# Patient Record
Sex: Female | Born: 1950
Health system: Southern US, Community
[De-identification: ages and names within clinical notes are randomized; demographics above are authoritative.]

## PROBLEM LIST (undated history)

## (undated) DIAGNOSIS — H269 Unspecified cataract: Secondary | ICD-10-CM

## (undated) DIAGNOSIS — K649 Unspecified hemorrhoids: Secondary | ICD-10-CM

## (undated) DIAGNOSIS — M1990 Unspecified osteoarthritis, unspecified site: Secondary | ICD-10-CM

## (undated) DIAGNOSIS — E785 Hyperlipidemia, unspecified: Secondary | ICD-10-CM

## (undated) DIAGNOSIS — C50919 Malignant neoplasm of unspecified site of unspecified female breast: Secondary | ICD-10-CM

## (undated) DIAGNOSIS — J00 Acute nasopharyngitis [common cold]: Secondary | ICD-10-CM

## (undated) DIAGNOSIS — M810 Age-related osteoporosis without current pathological fracture: Secondary | ICD-10-CM

## (undated) DIAGNOSIS — I10 Essential (primary) hypertension: Secondary | ICD-10-CM

## (undated) DIAGNOSIS — Z923 Personal history of irradiation: Secondary | ICD-10-CM

## (undated) DIAGNOSIS — K219 Gastro-esophageal reflux disease without esophagitis: Secondary | ICD-10-CM

## (undated) HISTORY — DX: Unspecified cataract: H26.9

## (undated) HISTORY — DX: Unspecified osteoarthritis, unspecified site: M19.90

## (undated) HISTORY — DX: Malignant neoplasm of unspecified site of unspecified female breast: C50.919

## (undated) HISTORY — DX: Essential (primary) hypertension: I10

## (undated) HISTORY — DX: Unspecified hemorrhoids: K64.9

## (undated) HISTORY — PX: TUBAL LIGATION: SHX77

## (undated) HISTORY — DX: Age-related osteoporosis without current pathological fracture: M81.0

## (undated) HISTORY — DX: Hyperlipidemia, unspecified: E78.5

---

## 2004-04-01 ENCOUNTER — Ambulatory Visit: Payer: Self-pay

## 2005-06-28 ENCOUNTER — Ambulatory Visit: Payer: Self-pay

## 2006-07-03 ENCOUNTER — Ambulatory Visit: Payer: Self-pay

## 2007-07-20 ENCOUNTER — Ambulatory Visit: Payer: Self-pay | Admitting: Unknown Physician Specialty

## 2007-07-24 ENCOUNTER — Ambulatory Visit: Payer: Self-pay | Admitting: Internal Medicine

## 2008-07-24 ENCOUNTER — Ambulatory Visit: Payer: Self-pay | Admitting: Internal Medicine

## 2009-07-28 ENCOUNTER — Ambulatory Visit: Payer: Self-pay | Admitting: Internal Medicine

## 2011-01-24 ENCOUNTER — Ambulatory Visit: Payer: Self-pay | Admitting: Internal Medicine

## 2012-08-29 DIAGNOSIS — Z720 Tobacco use: Secondary | ICD-10-CM | POA: Insufficient documentation

## 2012-08-29 DIAGNOSIS — M8580 Other specified disorders of bone density and structure, unspecified site: Secondary | ICD-10-CM | POA: Insufficient documentation

## 2012-08-29 DIAGNOSIS — E7800 Pure hypercholesterolemia, unspecified: Secondary | ICD-10-CM | POA: Insufficient documentation

## 2012-08-29 DIAGNOSIS — I10 Essential (primary) hypertension: Secondary | ICD-10-CM | POA: Insufficient documentation

## 2012-10-02 ENCOUNTER — Ambulatory Visit: Payer: Self-pay | Admitting: Family Medicine

## 2012-11-28 DIAGNOSIS — R32 Unspecified urinary incontinence: Secondary | ICD-10-CM | POA: Insufficient documentation

## 2012-11-28 DIAGNOSIS — K219 Gastro-esophageal reflux disease without esophagitis: Secondary | ICD-10-CM | POA: Insufficient documentation

## 2012-11-28 DIAGNOSIS — K644 Residual hemorrhoidal skin tags: Secondary | ICD-10-CM | POA: Insufficient documentation

## 2014-02-21 ENCOUNTER — Ambulatory Visit: Payer: Self-pay | Admitting: Family Medicine

## 2014-02-25 ENCOUNTER — Ambulatory Visit: Payer: Self-pay | Admitting: Family Medicine

## 2014-02-26 DIAGNOSIS — R928 Other abnormal and inconclusive findings on diagnostic imaging of breast: Secondary | ICD-10-CM | POA: Insufficient documentation

## 2014-03-24 ENCOUNTER — Ambulatory Visit: Payer: Self-pay | Admitting: Unknown Physician Specialty

## 2014-06-06 HISTORY — PX: COLONOSCOPY: SHX174

## 2014-08-05 DIAGNOSIS — Z2821 Immunization not carried out because of patient refusal: Secondary | ICD-10-CM | POA: Insufficient documentation

## 2014-08-27 ENCOUNTER — Ambulatory Visit: Payer: Self-pay | Admitting: *Deleted

## 2014-09-29 LAB — SURGICAL PATHOLOGY

## 2015-06-07 DIAGNOSIS — Z923 Personal history of irradiation: Secondary | ICD-10-CM

## 2015-06-07 HISTORY — DX: Personal history of irradiation: Z92.3

## 2015-11-13 ENCOUNTER — Other Ambulatory Visit: Payer: Self-pay | Admitting: Internal Medicine

## 2015-11-13 DIAGNOSIS — M8580 Other specified disorders of bone density and structure, unspecified site: Secondary | ICD-10-CM

## 2015-12-24 ENCOUNTER — Other Ambulatory Visit: Payer: Self-pay | Admitting: *Deleted

## 2015-12-24 DIAGNOSIS — Z1239 Encounter for other screening for malignant neoplasm of breast: Secondary | ICD-10-CM

## 2016-01-11 ENCOUNTER — Other Ambulatory Visit: Payer: Self-pay

## 2016-01-11 ENCOUNTER — Ambulatory Visit: Payer: Self-pay

## 2016-02-01 ENCOUNTER — Encounter: Payer: Self-pay | Admitting: Radiology

## 2016-02-01 ENCOUNTER — Ambulatory Visit
Admission: RE | Admit: 2016-02-01 | Discharge: 2016-02-01 | Disposition: A | Discharge status: Home / Self Care | Source: Ambulatory Visit | Attending: *Deleted | Admitting: *Deleted

## 2016-02-01 DIAGNOSIS — Z1239 Encounter for other screening for malignant neoplasm of breast: Secondary | ICD-10-CM

## 2016-02-01 DIAGNOSIS — N63 Unspecified lump in breast: Secondary | ICD-10-CM | POA: Diagnosis not present

## 2016-02-01 DIAGNOSIS — R921 Mammographic calcification found on diagnostic imaging of breast: Secondary | ICD-10-CM | POA: Insufficient documentation

## 2016-02-01 DIAGNOSIS — R928 Other abnormal and inconclusive findings on diagnostic imaging of breast: Secondary | ICD-10-CM | POA: Diagnosis present

## 2016-02-09 ENCOUNTER — Other Ambulatory Visit: Payer: Self-pay | Admitting: *Deleted

## 2016-02-09 DIAGNOSIS — N6320 Unspecified lump in the left breast, unspecified quadrant: Secondary | ICD-10-CM

## 2016-02-15 ENCOUNTER — Ambulatory Visit
Admission: RE | Admit: 2016-02-15 | Discharge: 2016-02-15 | Disposition: A | Discharge status: Home / Self Care | Source: Ambulatory Visit | Attending: *Deleted | Admitting: *Deleted

## 2016-02-15 ENCOUNTER — Encounter: Payer: Self-pay | Admitting: Oncology

## 2016-02-15 ENCOUNTER — Other Ambulatory Visit: Payer: Self-pay | Admitting: Rheumatology

## 2016-02-15 DIAGNOSIS — N6320 Unspecified lump in the left breast, unspecified quadrant: Secondary | ICD-10-CM

## 2016-02-15 DIAGNOSIS — N63 Unspecified lump in breast: Secondary | ICD-10-CM | POA: Diagnosis present

## 2016-02-15 DIAGNOSIS — C50919 Malignant neoplasm of unspecified site of unspecified female breast: Secondary | ICD-10-CM

## 2016-02-15 DIAGNOSIS — C50912 Malignant neoplasm of unspecified site of left female breast: Secondary | ICD-10-CM | POA: Insufficient documentation

## 2016-02-15 HISTORY — PX: BREAST BIOPSY: SHX20

## 2016-02-15 HISTORY — DX: Malignant neoplasm of unspecified site of unspecified female breast: C50.919

## 2016-02-17 LAB — SURGICAL PATHOLOGY

## 2016-02-18 NOTE — Progress Notes (Signed)
  Oncology Nurse Navigator Documentation  Navigator Location: [facility] (02/18/16 1200) Navigator Encounter Type: Introductory phone call (02/18/16 1200)   Abnormal Finding Date: 02/01/16 (02/18/16 1200) Confirmed Diagnosis Date: 02/15/16 (02/18/16 1200)     Patient Visit Type: Initial (02/18/16 1200) Treatment Phase: Pre-Tx/Tx Discussion (02/18/16 1200) Barriers/Navigation Needs: Education;Coordination of Care (02/18/16 1200) Education: Accessing Care/ Finding Providers;Coping with Diagnosis/ Prognosis;Newly Diagnosed Cancer Education (02/18/16 1200) Interventions: Coordination of Care (02/18/16 1200)   Coordination of Care: Appts (02/18/16 1200)                  Time Spent with Patient: 60 (02/18/16 1200)  Introduced patient to [employer].  Scheduled Surgical consult for Monday 02/22/16 at 11:30 with Dr. Bary Castilla, and MedOnc with Dr. Grayland Ormond Tuesday 02/23/16 at 10:00.  Notified primary physician of appointments.  Collected risk factor/history data for case conference.  Patient request to receive Breast Cancer Treatment Handbook at physician appointment.

## 2016-02-19 ENCOUNTER — Encounter: Payer: Self-pay | Admitting: *Deleted

## 2016-02-22 ENCOUNTER — Ambulatory Visit (INDEPENDENT_AMBULATORY_CARE_PROVIDER_SITE_OTHER): Admitting: General Surgery

## 2016-02-22 ENCOUNTER — Ambulatory Visit: Payer: Self-pay

## 2016-02-22 ENCOUNTER — Encounter: Payer: Self-pay | Admitting: General Surgery

## 2016-02-22 VITALS — BP 150/82 | HR 76 | Resp 12 | Ht 66.0 in | Wt 209.0 lb

## 2016-02-22 DIAGNOSIS — C50412 Malignant neoplasm of upper-outer quadrant of left female breast: Secondary | ICD-10-CM

## 2016-02-22 DIAGNOSIS — N6320 Unspecified lump in the left breast, unspecified quadrant: Secondary | ICD-10-CM

## 2016-02-22 DIAGNOSIS — C50919 Malignant neoplasm of unspecified site of unspecified female breast: Secondary | ICD-10-CM | POA: Insufficient documentation

## 2016-02-22 NOTE — Progress Notes (Addendum)
[place]  [phone] [phone]  [name] OB: [birth year]  MR#: [id number]  STM#:[id number]  Patient Care Team: Peggyann Juba, NP as PCP - General Peggyann Juba, NP as Nurse Practitioner Robert Bellow, MD (General Surgery)  CHIEF COMPLAINT: Clinical stage IA ER/PR positive adenocarcinoma of the upper outer quadrant of the left breast.  INTERVAL HISTORY: Patient is a 65 year old female who was noted to have an abnormality on routine breast screening mammogram. Supplement ultrasound and biopsy revealed the above stated breast cancer. She currently feels well and is asymptomatic. She has no neurologic plates. She denies any recent fevers or illnesses. She has good appetite and denies weight loss. She has no chest pain or shortness of breath. She denies any nausea, vomiting, constipation, or diarrhea. She has no urinary complaints. Patient feels at her baseline and offers no specific complaints today.  REVIEW OF SYSTEMS:   Review of Systems  Constitutional: Negative.  Negative for fever, malaise/fatigue and weight loss.  Respiratory: Negative.  Negative for cough and shortness of breath.   Cardiovascular: Negative.  Negative for chest pain.  Gastrointestinal: Negative for abdominal pain.  Genitourinary: Negative.   Musculoskeletal: Negative.   Neurological: Negative.  Negative for weakness.  Psychiatric/Behavioral: Negative.  The patient is not nervous/anxious.     As per HPI. Otherwise, a complete review of systems is negative.  PAST MEDICAL HISTORY: Past Medical History:  Diagnosis Date  . Arthritis   . Breast cancer ([place])   . Cataract   . Hemorrhoids   . Hyperlipidemia   . Hypertension   . Osteoporosis     PAST SURGICAL HISTORY: Past Surgical History:  Procedure Laterality Date  . BREAST BIOPSY Left 02/15/2016   path pending  . COLONOSCOPY  2016    FAMILY HISTORY: Family History  Problem Relation Age of  Onset  . Colon cancer Mother   . Breast cancer Maternal Aunt   . Prostate cancer Father     ADVANCED DIRECTIVES (Y/N):  N  HEALTH MAINTENANCE: Social History  Substance Use Topics  . Smoking status: Current Every Day Smoker    Packs/day: 1.00    Years: 40.00    Types: Cigarettes  . Smokeless tobacco: Never Used  . Alcohol use 1.2 oz/week    2 Glasses of wine per week     Colonoscopy:  PAP:  Bone density:  Lipid panel:  Allergies  Allergen Reactions  . Ibuprofen Swelling  . Sulfamethizole Rash    Other reaction(s): UNKNOWN    Current Outpatient Prescriptions  Medication Sig Dispense Refill  . atorvastatin (LIPITOR) 20 MG tablet Take 20 mg by mouth daily at 6 PM.     . calcium carbonate (OSCAL) 1500 (600 Ca) MG TABS tablet Take 1,500 mg by mouth daily with breakfast.    . folic acid (FOLVITE) 1 MG tablet Take 1 mg by mouth daily.    Marland Kitchen losartan-hydrochlorothiazide (HYZAAR) 50-12.5 MG tablet Take 1 tablet by mouth daily.     . Multiple Vitamin (MULTIVITAMIN) tablet Take 1 tablet by mouth daily.    . naproxen sodium (ANAPROX) 220 MG tablet Take 440 mg by mouth 2 (two) times daily with a meal.    . ranitidine (ZANTAC) 150 MG tablet Take 150 mg by mouth daily.      No current facility-administered medications for this visit.     OBJECTIVE: Vitals:   02/23/16 0959  BP: 137/81  Pulse: (!) 101  Resp: 18  Temp: 97.6  F (36.4 C)     Body mass index is 33.2 kg/m.    ECOG FS:0 - Asymptomatic  General: Well-developed, well-nourished, no acute distress. Eyes: Pink conjunctiva, anicteric sclera. HEENT: Normocephalic, moist mucous membranes, clear oropharnyx. Breasts: Patient had breast exam yesterday with surgery and requested exam be deferred today. Lungs: Clear to auscultation bilaterally. Heart: Regular rate and rhythm. No rubs, murmurs, or gallops. Abdomen: Soft, nontender, nondistended. No organomegaly noted, normoactive bowel sounds. Musculoskeletal: No edema,  cyanosis, or clubbing. Neuro: Alert, answering all questions appropriately. Cranial nerves grossly intact. Skin: No rashes or petechiae noted. Psych: Normal affect. Lymphatics: No cervical, calvicular, axillary or inguinal LAD.   LAB RESULTS:  No results found for: NA, K, CL, CO2, GLUCOSE, BUN, CREATININE, CALCIUM, PROT, ALBUMIN, AST, ALT, ALKPHOS, BILITOT, GFRNONAA, GFRAA  No results found for: WBC, NEUTROABS, HGB, HCT, MCV, PLT   STUDIES: US Breast Complete Uni Left Inc Axilla  Result Date: 02/22/2016 Ultrasound examination of the left breast was completed to determine if preoperative needle localization would be required. Moderate distortion from her recently completed biopsy is evident. In the 2:00 position of the breast, 5 cm from nipple and area of architectural distortion nodularity corresponding to that previously identified is seen. This measures up to 0.64 cm in diameter. BI-RADS-6.  US Breast Ltd Uni Left Inc Axilla  Result Date: 02/01/2016 CLINICAL DATA:  Patient for short-term follow-up left breast calcifications. EXAM: 2D DIGITAL DIAGNOSTIC BILATERAL MAMMOGRAM WITH CAD AND ADJUNCT TOMO ULTRASOUND LEFT BREAST COMPARISON:  Previous exam(s). ACR Breast Density Category b: There are scattered areas of fibroglandular density. FINDINGS: Magnification CC and true lateral views demonstrate the previously described punctate calcifications to be less discrete on current examination, compatible with benign etiology. No new or increasing calcifications are identified. Additionally, within the upper-outer left breast there is an 8 mm irregular mass. No suspicious abnormalities identified within the right breast. Mammographic images were processed with CAD. On physical exam, I palpate no discrete mass within the upper-outer left breast. Targeted ultrasound is performed, showing a 7 x 4 x 7 mm irregular hypoechoic mass left breast 2 o'clock position 6 cm from nipple. No left axillary  lymphadenopathy. IMPRESSION: Suspicious left breast mass. RECOMMENDATION: Ultrasound-guided core needle biopsy left breast mass. I have discussed the findings and recommendations with the patient. Results were also provided in writing at the conclusion of the visit. If applicable, a reminder letter will be sent to the patient regarding the next appointment. BI-RADS CATEGORY  4: Suspicious. Electronically Signed   By: Lovey Newcomer M.D.   On: 02/01/2016 15:47   Mm Diag Breast Tomo Bilateral  Result Date: 02/01/2016 CLINICAL DATA:  Patient for short-term follow-up left breast calcifications. EXAM: 2D DIGITAL DIAGNOSTIC BILATERAL MAMMOGRAM WITH CAD AND ADJUNCT TOMO ULTRASOUND LEFT BREAST COMPARISON:  Previous exam(s). ACR Breast Density Category b: There are scattered areas of fibroglandular density. FINDINGS: Magnification CC and true lateral views demonstrate the previously described punctate calcifications to be less discrete on current examination, compatible with benign etiology. No new or increasing calcifications are identified. Additionally, within the upper-outer left breast there is an 8 mm irregular mass. No suspicious abnormalities identified within the right breast. Mammographic images were processed with CAD. On physical exam, I palpate no discrete mass within the upper-outer left breast. Targeted ultrasound is performed, showing a 7 x 4 x 7 mm irregular hypoechoic mass left breast 2 o'clock position 6 cm from nipple. No left axillary lymphadenopathy. IMPRESSION: Suspicious left breast mass. RECOMMENDATION: Ultrasound-guided core  needle biopsy left breast mass. I have discussed the findings and recommendations with the patient. Results were also provided in writing at the conclusion of the visit. If applicable, a reminder letter will be sent to the patient regarding the next appointment. BI-RADS CATEGORY  4: Suspicious. Electronically Signed   By: Lovey Newcomer M.D.   On: 02/01/2016 15:47   Mm Clip  Placement Left  Result Date: 02/15/2016 CLINICAL DATA:  Status post ultrasound-guided biopsy of a left breast mass earlier today. EXAM: DIAGNOSTIC LEFT MAMMOGRAM POST ULTRASOUND BIOPSY COMPARISON:  Previous exam(s). FINDINGS: Mammographic images were obtained following ultrasound guided biopsy of the left breast mass at the 2 o'clock axis. At the conclusion of the procedure, a coil shaped tissue marker was placed at the biopsy site. This biopsy clip is well positioned at the biopsy site, corresponding to the original mammographic finding. IMPRESSION: Postprocedure mammogram for clip placement. Coil shaped clip well-positioned at the biopsy site. Final Assessment: Post Procedure Mammograms for Marker Placement Electronically Signed   By: Franki Cabot M.D.   On: 02/15/2016 09:12   [place] Lt Breast Bx W Loc Dev 1st Lesion Img Bx Spec US Guide  Addendum Date: 02/18/2016   ADDENDUM REPORT: 02/18/2016 12:48 ADDENDUM: Pathology and recommendations were relayed to Al Pimple, RN, nurse navigator for [place] by Jetta Lout, [place] [facility]). An appointment has been made with Dr. Bary Castilla, surgeon, on 02/22/16 at 11:30 AM and with Dr. Grayland Ormond, oncologist, on 02/23/16 at 10:00 AM. The patient has been notified by the nurse navigator. Addendum by Jetta Lout, RRA on 02/18/16. Electronically Signed   By: Claudie Revering M.D.   On: 02/18/2016 12:48   Addendum Date: 02/16/2016   ADDENDUM REPORT: 02/16/2016 17:56 ADDENDUM: The final pathological diagnosis is invasive mammary carcinoma. This is concordant with the imaging findings. Surgical consultation is recommended. The final pathological diagnosis and recommendation were discussed with the patient by telephone on 02/16/2016. Her questions were answered. She reported no pain, bruising or palpable hematoma at the biopsy site. The patient would prefer to have her surgery and treatments at [facility]. She does  not have a Psychologist, sport and exercise preference. At Dr. Thompson Caul request, surgical and oncological consultation will be arranged by the nurse navigators at the [facility] . Electronically Signed   By: Claudie Revering M.D.   On: 02/16/2016 17:56   Result Date: 02/18/2016 CLINICAL DATA:  Patient with left breast mass presents today for ultrasound-guided biopsy. EXAM: ULTRASOUND GUIDED LEFT BREAST CORE NEEDLE BIOPSY COMPARISON:  Previous exam(s). PROCEDURE: I met with the patient and we discussed the procedure of ultrasound-guided biopsy, including benefits and alternatives. We discussed the high likelihood of a successful procedure. We discussed the risks of the procedure including infection, bleeding, tissue injury, clip migration, and inadequate sampling. Informed written consent was given. The usual time-out protocol was performed immediately prior to the procedure. Using sterile technique and 1% Lidocaine as local anesthetic, under direct ultrasound visualization, a 12 gauge spring-loaded device was used to perform biopsy of the left breast mass at the 2 o'clock axisusing a lateral approach. At the conclusion of the procedure, a coil shaped tissue marker clip was deployed into the biopsy cavity. Follow-up 2-view mammogram was performed and dictated separately. IMPRESSION: Ultrasound-guided biopsy of the left breast mass at the 2 o'clock axis. No apparent complications. Electronically Signed: By: Franki Cabot M.D. On: 02/15/2016 09:01    ASSESSMENT: Clinical stage IA ER/PR positive  adenocarcinoma of the upper outer quadrant of the left breast. Low risk MammoPrint.  PLAN:    1. Clinical stage IA ER/PR positive adenocarcinoma of the upper outer quadrant of the left breast: Patient had consultation with surgery yesterday to discuss lumpectomy past radiation versus mastectomy. Prior to undergoing surgery, patient has a wedding in [place] therefore will likely schedule her surgery on or about March 17, 2016. Patient will return to clinic 1-2 weeks after her surgery to discuss the final pathology results and to determine whether chemotherapy is necessary. Ultimately, at the conclusion of all patients treatments, she will require an aromatase inhibitor for 5 years.  Approximately 45 minutes was spent in discussion of which greater than 50% was consultation.  Patient expressed understanding and was in agreement with this plan. She also understands that She can call clinic at any time with any questions, concerns, or complaints.   Breast cancer of upper-outer quadrant of left female breast [facility])   Staging form: Breast, AJCC 7th Edition   - Clinical stage from 02/22/2016: Stage IA (T1b, N0, M0) - Signed by Lloyd Huger, MD on 02/22/2016  Lloyd Huger, MD   02/23/2016 12:35 PM

## 2016-02-22 NOTE — Progress Notes (Addendum)
Patient [name], female   DOB: [birth year], 65 y.o.   MRN: [id number]  Chief Complaint  Patient presents with  . Follow-up    mammogram    HPI Karen Phillips is a 65 y.o. female who presents for a breast evaluation. The most recent mammogram was done on 02/01/16 and biopsy done on 02/15/16. Marland Kitchen  Patient does perform regular self breast checks and gets regular mammograms done.   The patient reports that she is not aware of any changes in her breast.  She was accompanied today by her husband of 34 years, [name]. He is retired from [employer]. 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  HPI  Past Medical History:  Diagnosis Date  . Hyperlipidemia   . Hypertension     Past Surgical History:  Procedure Laterality Date  . BREAST BIOPSY Left 02/15/2016   path pending  . COLONOSCOPY  2016    Family History  Problem Relation Age of Onset  . Colon cancer Mother   . Breast cancer Maternal Aunt     Social History Social History  Substance Use Topics  . Smoking status: Current Every Day Smoker    Packs/day: 1.00    Years: 40.00    Types: Cigarettes  . Smokeless tobacco: Never Used  . Alcohol use 1.2 oz/week    2 Glasses of wine per week    Allergies  Allergen Reactions  . Ibuprofen Swelling  . Sulfamethizole Rash    Other reaction(s): UNKNOWN    Current Outpatient Prescriptions  Medication Sig Dispense Refill  . aspirin EC 81 MG tablet Take 81 mg by mouth.    Marland Kitchen atorvastatin (LIPITOR) 20 MG tablet Take by mouth.    . losartan-hydrochlorothiazide (HYZAAR) 50-12.5 MG tablet     . ranitidine (ZANTAC) 150 MG tablet Take by mouth.     No current facility-administered medications for this visit.     Review of Systems Review of Systems  Constitutional: Negative.   Respiratory: Negative.   Cardiovascular: Negative.     Blood pressure (!) 150/82, pulse 76, resp. rate 12, height _0  (1.676 m), weight 209 lb (94.8 kg).  Physical  Exam Physical Exam  Constitutional: She is oriented to person, place, and time. She appears well-developed and well-nourished.  Eyes: Conjunctivae are normal. No scleral icterus.  Neck: Neck supple.  Cardiovascular: Normal rate, regular rhythm and normal heart sounds.   Pulmonary/Chest: Effort normal and breath sounds normal. Right breast exhibits no inverted nipple, no mass, no nipple discharge, no skin change and no tenderness. Left breast exhibits no inverted nipple, no nipple discharge, no skin change and no tenderness.    Abdominal: Soft. Normal appearance and bowel sounds are normal. There is no hepatomegaly. There is no tenderness. No hernia.  Lymphadenopathy:    She has no cervical adenopathy.    She has no axillary adenopathy.  Neurological: She is alert and oriented to person, place, and time.  Skin: Skin is warm and dry.    Data Reviewed Biopsy results of 04/16/2016 were reviewed. 7 mm invasive mammary carcinoma, ER 90%, PR 90%, HER-2/neu not overexpressed.  Preprocedure mammograms and ultrasound reviewed.  Ultrasound examination of the left breast was completed to determine if preoperative needle localization would be required. Moderate distortion from her recently completed biopsy is evident. In the 2:00 position of the breast, 5 cm from nipple and area of architectural distortion nodularity corresponding to that previously identified is seen. This measures up to  0.64 cm in diameter. BI-RADS-6.  Assessment    Stage 1 carcinoma left breast.    Plan    The majority of the visit was spent reviewing the options for breast cancer treatment. Breast conservation with lumpectomy and radiation therapy  was presented as equivalent to mastectomy for long-term control. The pros and cons of each treatment regimen were reviewed. The indications for additional therapy such as chemotherapy were touched on briefly, realizing that the majority of information required to determine if  chemotherapy would be of benefit is not available at this time. In sees the patient is presently scheduled to meet with medical oncology tomorrow). The availability of consultation services for medical oncology and radiation oncology prior to surgery were reviewed.  Options for second surgical opinion were reviewed.  Likely benefit of Mammoprint testing was discussed.  Informational brochure and website information provided.  The patient and her husband are scheduled to go to a wedding in the next couple of weeks. It's very reasonable postpone any surgical intervention until after the return. Would have appreciative visit preop to confirm the biopsy site is clearly identifiable and the needle localization is not required.  There were encouraged to call for any questions. About 45 minutes were spent reviewing options for management.     This information has been scribed by Gaspar Cola CMA.   Robert Bellow 02/22/2016, 9:25 PM

## 2016-02-23 ENCOUNTER — Encounter (INDEPENDENT_AMBULATORY_CARE_PROVIDER_SITE_OTHER): Payer: Self-pay

## 2016-02-23 ENCOUNTER — Encounter: Payer: Self-pay | Admitting: Oncology

## 2016-02-23 ENCOUNTER — Inpatient Hospital Stay: Attending: Oncology | Admitting: Oncology

## 2016-02-23 DIAGNOSIS — Z79899 Other long term (current) drug therapy: Secondary | ICD-10-CM | POA: Diagnosis not present

## 2016-02-23 DIAGNOSIS — C50412 Malignant neoplasm of upper-outer quadrant of left female breast: Secondary | ICD-10-CM | POA: Diagnosis present

## 2016-02-23 DIAGNOSIS — E785 Hyperlipidemia, unspecified: Secondary | ICD-10-CM | POA: Insufficient documentation

## 2016-02-23 DIAGNOSIS — M1990 Unspecified osteoarthritis, unspecified site: Secondary | ICD-10-CM | POA: Diagnosis not present

## 2016-02-23 DIAGNOSIS — I10 Essential (primary) hypertension: Secondary | ICD-10-CM | POA: Insufficient documentation

## 2016-02-23 DIAGNOSIS — M818 Other osteoporosis without current pathological fracture: Secondary | ICD-10-CM | POA: Insufficient documentation

## 2016-02-23 DIAGNOSIS — Z170 Estrogen receptor positive status [ER+]: Secondary | ICD-10-CM | POA: Diagnosis not present

## 2016-02-23 DIAGNOSIS — F17210 Nicotine dependence, cigarettes, uncomplicated: Secondary | ICD-10-CM | POA: Diagnosis not present

## 2016-02-23 NOTE — Progress Notes (Signed)
  Oncology Nurse Navigator Documentation  Navigator Location: [facility] Onc (02/23/16 1400) Navigator Encounter Type: Initial MedOnc (02/23/16 1400)           Patient Visit Type: Initial;MedOnc (02/23/16 1400) Treatment Phase: Pre-Tx/Tx Discussion (02/23/16 1400) Barriers/Navigation Needs: Education;Coordination of Care (02/23/16 1400) Education: Newly Diagnosed Cancer Education;Preparing for Upcoming Surgery/ Treatment (02/23/16 1400) Interventions: Coordination of Care (02/23/16 1400)                      Time Spent with Patient: 120 (02/23/16 1400)   Met patient and husband at initial [place] visit.   Given Breast Cancer Treatment Handbook.  Phoned Wells Guiles at [facility] to notify patient interested in lumpectomy ,and would like to schedule 03/17/16 or later.  She will be out of town until 03/14/16.

## 2016-02-23 NOTE — Progress Notes (Signed)
New evaluation for breast cancer. States is feeling well. Offers no complaints.

## 2016-02-25 ENCOUNTER — Other Ambulatory Visit: Payer: Self-pay | Admitting: Rheumatology

## 2016-02-25 DIAGNOSIS — M25552 Pain in left hip: Secondary | ICD-10-CM

## 2016-02-25 DIAGNOSIS — R937 Abnormal findings on diagnostic imaging of other parts of musculoskeletal system: Secondary | ICD-10-CM

## 2016-02-29 ENCOUNTER — Telehealth: Payer: Self-pay

## 2016-02-29 NOTE — Telephone Encounter (Signed)
Call back to patient to see about scheduling her breast surgery. She states that she will be out of town starting on 03/09/16 and will be gone until 03/15/16. She would like her surgery for the following week. The patient is scheduled for surgery at [facility] on 03/22/16. She will pre admit by phone. She will follow up here for a pre op appointment with Dr Bary Castilla on 03/17/16 at 9:15 am. The patient is aware of dates, time, and instructions.

## 2016-03-01 ENCOUNTER — Other Ambulatory Visit: Payer: Self-pay | Admitting: General Surgery

## 2016-03-03 ENCOUNTER — Other Ambulatory Visit: Payer: Self-pay | Admitting: General Surgery

## 2016-03-04 ENCOUNTER — Other Ambulatory Visit: Payer: Self-pay | Admitting: General Surgery

## 2016-03-04 ENCOUNTER — Other Ambulatory Visit: Payer: Self-pay

## 2016-03-04 DIAGNOSIS — C50412 Malignant neoplasm of upper-outer quadrant of left female breast: Secondary | ICD-10-CM

## 2016-03-07 ENCOUNTER — Ambulatory Visit
Admission: RE | Admit: 2016-03-07 | Discharge: 2016-03-07 | Disposition: A | Discharge status: Home / Self Care | Source: Ambulatory Visit | Attending: Rheumatology | Admitting: Rheumatology

## 2016-03-07 DIAGNOSIS — M12852 Other specific arthropathies, not elsewhere classified, left hip: Secondary | ICD-10-CM | POA: Diagnosis not present

## 2016-03-07 DIAGNOSIS — K5730 Diverticulosis of large intestine without perforation or abscess without bleeding: Secondary | ICD-10-CM | POA: Insufficient documentation

## 2016-03-07 DIAGNOSIS — I708 Atherosclerosis of other arteries: Secondary | ICD-10-CM | POA: Diagnosis not present

## 2016-03-07 DIAGNOSIS — M25552 Pain in left hip: Secondary | ICD-10-CM

## 2016-03-07 DIAGNOSIS — R937 Abnormal findings on diagnostic imaging of other parts of musculoskeletal system: Secondary | ICD-10-CM

## 2016-03-15 ENCOUNTER — Inpatient Hospital Stay: Admission: RE | Admit: 2016-03-15 | Source: Ambulatory Visit

## 2016-03-17 ENCOUNTER — Encounter: Payer: Self-pay | Admitting: General Surgery

## 2016-03-17 ENCOUNTER — Ambulatory Visit (INDEPENDENT_AMBULATORY_CARE_PROVIDER_SITE_OTHER): Admitting: General Surgery

## 2016-03-17 ENCOUNTER — Encounter: Payer: Self-pay | Admitting: *Deleted

## 2016-03-17 VITALS — BP 144/82 | HR 82 | Resp 14 | Ht 66.0 in | Wt 205.0 lb

## 2016-03-17 DIAGNOSIS — C50412 Malignant neoplasm of upper-outer quadrant of left female breast: Secondary | ICD-10-CM

## 2016-03-17 NOTE — Patient Instructions (Signed)
The patient is aware to call back for any questions or concerns.  

## 2016-03-17 NOTE — Patient Instructions (Addendum)
  Your procedure is scheduled on: 03/22/16 Report to Radiology. At 9:15 AM   Remember: Instructions that are not followed completely may result in serious medical risk, up to and including death, or upon the discretion of your surgeon and anesthesiologist your surgery may need to be rescheduled.    X____ 1. Do not eat food or drink liquids after midnight. No gum chewing or hard candies.     __X__ 2. No Alcohol for 24 hours before or after surgery.   __X__ 3. Do Not Smoke For 24 Hours Prior to Your Surgery.   ____ 4. Bring all medications with you on the day of surgery if instructed.    _X___ 5. Notify your doctor if there is any change in your medical condition     (cold, fever, infections).       Do not wear jewelry, make-up, hairpins, clips or nail polish.  Do not wear lotions, powders, or perfumes. You may wear deodorant.  Do not shave 48 hours prior to surgery. Men may shave face and neck.  Do not bring valuables to the hospital.    [facility] is not responsible for any belongings or valuables.               Contacts, dentures or bridgework may not be worn into surgery.  Leave your suitcase in the car. After surgery it may be brought to your room.  For patients admitted to the hospital, discharge time is determined by your                treatment team.   Patients discharged the day of surgery will not be allowed to drive home.      __X__ Take these medicines the morning of surgery with A SIP OF WATER:    1. ZANTAC AT BEDTIME 03/21/16 AND AM OF SURGERY  2.   3.   4.  5.  6.  ____ Fleet Enema (as directed)   _X___ Use CHG Soap as directed  ____ Use inhalers on the day of surgery  ____ Stop metformin 2 days prior to surgery    ____ Take 1/2 of usual insulin dose the night before surgery and none on the morning of surgery.   ____ Stop Coumadin/Plavix/aspirin on   _X ___ Stop Anti-inflammatories on      STOP NAPROXYN AS INSTRUCTED BY DR [name]'S OFFICE   ____  Stop supplements until after surgery.    ____ Bring C-Pap to the hospital.   GIVEN LIVING WILL PACKET

## 2016-03-17 NOTE — Progress Notes (Signed)
Patient ID: MUNTAZ PALLAN, female   DOB: Aug 15, 1950, 65 y.o.   MRN: [id number]  Chief Complaint  Patient presents with  . Pre-op Exam    HPI Karen Phillips is a 65 y.o. female here today for her pre op for surgery excision left breast mass with SN biopsy on 03-22-16. The long, drawn out battle to allow the patient have surgery locally has finally resolved. She is here today with her husband, Karen Phillips.  HPI  Past Medical History:  Diagnosis Date  . Arthritis   . Breast cancer ([place]) 02/15/2016   INVASIVE MAMMARY CARCINOMA  . Cataract   . Hemorrhoids   . Hyperlipidemia   . Hypertension   . Osteoporosis     Past Surgical History:  Procedure Laterality Date  . BREAST BIOPSY Left 02/15/2016   INVASIVE MAMMARY CARCINOMA  . COLONOSCOPY  2016    Family History  Problem Relation Age of Onset  . Colon cancer Mother   . Breast cancer Maternal Aunt   . Prostate cancer Father     Social History Social History  Substance Use Topics  . Smoking status: Current Every Day Smoker    Packs/day: 1.00    Years: 40.00    Types: Cigarettes  . Smokeless tobacco: Never Used  . Alcohol use 1.2 oz/week    2 Glasses of wine per week    Allergies  Allergen Reactions  . Ibuprofen Swelling  . Sulfamethizole Rash    Other reaction(s): UNKNOWN    Current Outpatient Prescriptions  Medication Sig Dispense Refill  . atorvastatin (LIPITOR) 20 MG tablet Take 20 mg by mouth daily at 6 PM.     . calcium carbonate (OSCAL) 1500 (600 Ca) MG TABS tablet Take 1,500 mg by mouth daily with breakfast.    . folic acid (FOLVITE) 1 MG tablet Take 1 mg by mouth daily.    Marland Kitchen losartan-hydrochlorothiazide (HYZAAR) 50-12.5 MG tablet Take 1 tablet by mouth daily.     . Multiple Vitamin (MULTIVITAMIN) tablet Take 1 tablet by mouth daily.    . naproxen (NAPROSYN) 500 MG tablet Take 500 mg by mouth 2 (two) times daily with a meal.    . ranitidine (ZANTAC) 150 MG tablet Take 150 mg by mouth daily.       No current facility-administered medications for this visit.     Review of Systems Review of Systems  Constitutional: Negative.   Respiratory: Negative.   Cardiovascular: Negative.     Blood pressure (!) 144/82, pulse 82, resp. rate 14, height 5\' 6"  (1.676 m), weight 205 lb (93 kg).  Physical Exam Physical Exam  Constitutional: She is oriented to person, place, and time. She appears well-developed and well-nourished.  HENT:  Mouth/Throat: Oropharynx is clear and moist.  Eyes: Conjunctivae are normal. No scleral icterus.  Neck: Neck supple.  Cardiovascular: Normal rate, regular rhythm and normal heart sounds.   Pulmonary/Chest: Effort normal and breath sounds normal.  Lymphadenopathy:    She has no cervical adenopathy.  Neurological: She is alert and oriented to person, place, and time.  Skin: Skin is warm and dry.  Psychiatric: Her behavior is normal.    Data Reviewed Repeat ultrasound was undertaken (no charge, no images) sees showing the previous biopsy site in the 2:00 position of the left breast, 6 cm from the nipple clearly visible. Needle localization will not be required. BI-RADS-6.  Assessment    Left breast cancer, desires breast conservation.    Plan    Reviewed the  plans for wide excision and sentinel node biopsy. Postoperative care reviewed.     Follow up as scheduled after surgery.  This information has been scribed by Karie Fetch RN, BSN,BC.   Robert Bellow 03/18/2016, 8:36 AM

## 2016-03-18 ENCOUNTER — Encounter
Admission: RE | Admit: 2016-03-18 | Discharge: 2016-03-18 | Disposition: A | Discharge status: Home / Self Care | Source: Ambulatory Visit | Attending: General Surgery | Admitting: General Surgery

## 2016-03-18 DIAGNOSIS — I10 Essential (primary) hypertension: Secondary | ICD-10-CM | POA: Diagnosis not present

## 2016-03-18 DIAGNOSIS — Z01818 Encounter for other preprocedural examination: Secondary | ICD-10-CM | POA: Insufficient documentation

## 2016-03-18 LAB — HEMOGLOBIN: Hemoglobin: 13.7 g/dL (ref 12.0–16.0)

## 2016-03-18 LAB — BASIC METABOLIC PANEL
Anion gap: 10 (ref 5–15)
BUN: 21 mg/dL — AB (ref 6–20)
CHLORIDE: 107 mmol/L (ref 101–111)
CO2: 24 mmol/L (ref 22–32)
CREATININE: 0.72 mg/dL (ref 0.44–1.00)
Calcium: 9.5 mg/dL (ref 8.9–10.3)
GFR calc Af Amer: >60 mL/min (ref >60)
GFR calc non Af Amer: >60 mL/min (ref >60)
GLUCOSE: 103 mg/dL — AB (ref 65–99)
POTASSIUM: 4 mmol/L (ref 3.5–5.1)
SODIUM: 141 mmol/L (ref 135–145)

## 2016-03-22 ENCOUNTER — Ambulatory Visit

## 2016-03-22 ENCOUNTER — Ambulatory Visit
Admission: RE | Admit: 2016-03-22 | Discharge: 2016-03-22 | Disposition: A | Discharge status: Home / Self Care | Source: Ambulatory Visit | Attending: General Surgery | Admitting: General Surgery

## 2016-03-22 ENCOUNTER — Ambulatory Visit: Admitting: Anesthesiology

## 2016-03-22 ENCOUNTER — Encounter
Admission: RE | Disposition: A | Discharge status: Home / Self Care | Payer: Self-pay | Source: Ambulatory Visit | Attending: General Surgery

## 2016-03-22 ENCOUNTER — Encounter: Admission: RE | Admit: 2016-03-22 | Source: Ambulatory Visit

## 2016-03-22 ENCOUNTER — Encounter
Admission: RE | Admit: 2016-03-22 | Discharge: 2016-03-22 | Disposition: A | Discharge status: Home / Self Care | Source: Ambulatory Visit | Attending: General Surgery | Admitting: General Surgery

## 2016-03-22 ENCOUNTER — Encounter: Payer: Self-pay | Admitting: *Deleted

## 2016-03-22 DIAGNOSIS — Z886 Allergy status to analgesic agent status: Secondary | ICD-10-CM | POA: Insufficient documentation

## 2016-03-22 DIAGNOSIS — I10 Essential (primary) hypertension: Secondary | ICD-10-CM | POA: Insufficient documentation

## 2016-03-22 DIAGNOSIS — Z882 Allergy status to sulfonamides status: Secondary | ICD-10-CM | POA: Diagnosis not present

## 2016-03-22 DIAGNOSIS — C50412 Malignant neoplasm of upper-outer quadrant of left female breast: Secondary | ICD-10-CM

## 2016-03-22 DIAGNOSIS — M810 Age-related osteoporosis without current pathological fracture: Secondary | ICD-10-CM | POA: Insufficient documentation

## 2016-03-22 DIAGNOSIS — Z170 Estrogen receptor positive status [ER+]: Secondary | ICD-10-CM | POA: Diagnosis not present

## 2016-03-22 DIAGNOSIS — F17210 Nicotine dependence, cigarettes, uncomplicated: Secondary | ICD-10-CM | POA: Insufficient documentation

## 2016-03-22 DIAGNOSIS — J449 Chronic obstructive pulmonary disease, unspecified: Secondary | ICD-10-CM | POA: Diagnosis not present

## 2016-03-22 DIAGNOSIS — C50912 Malignant neoplasm of unspecified site of left female breast: Secondary | ICD-10-CM | POA: Diagnosis present

## 2016-03-22 DIAGNOSIS — E785 Hyperlipidemia, unspecified: Secondary | ICD-10-CM | POA: Diagnosis not present

## 2016-03-22 HISTORY — PX: BREAST LUMPECTOMY WITH SENTINEL LYMPH NODE BIOPSY: SHX5597

## 2016-03-22 SURGERY — BREAST LUMPECTOMY WITH SENTINEL LYMPH NODE BX
Anesthesia: General | Laterality: Left | Wound class: Clean

## 2016-03-22 MED ORDER — METHYLENE BLUE 0.5 % INJ SOLN
INTRAVENOUS | Status: AC
  Filled 2016-03-22: qty 10

## 2016-03-22 MED ORDER — METHYLENE BLUE 0.5 % INJ SOLN
INTRAVENOUS | Status: DC | PRN
  Administered 2016-03-22: 5 mL

## 2016-03-22 MED ORDER — HYDROCODONE-ACETAMINOPHEN 5-325 MG PO TABS
ORAL_TABLET | ORAL | Status: DC
Start: 2016-03-22 — End: 2016-03-22
  Filled 2016-03-22: qty 1

## 2016-03-22 MED ORDER — HYDROCODONE-ACETAMINOPHEN 5-325 MG PO TABS: 1.0000 | ORAL_TABLET | ORAL | 0 refills | Status: DC | PRN

## 2016-03-22 MED ORDER — TECHNETIUM TC 99M SULFUR COLLOID
1.0980 | Freq: Once | INTRAVENOUS | Status: AC | PRN
  Administered 2016-03-22: 1.098 via INTRAVENOUS

## 2016-03-22 MED ORDER — ONDANSETRON HCL 4 MG/2ML IJ SOLN: 4.0000 mg | Freq: Once | INTRAMUSCULAR | Status: DC | PRN

## 2016-03-22 MED ORDER — LACTATED RINGERS IV SOLN
INTRAVENOUS | Status: DC
  Administered 2016-03-22: 09:00:00 via INTRAVENOUS

## 2016-03-22 MED ORDER — FENTANYL CITRATE (PF) 100 MCG/2ML IJ SOLN
25.0000 ug | INTRAMUSCULAR | Status: DC | PRN
  Administered 2016-03-22 (×3): 25 ug via INTRAVENOUS

## 2016-03-22 MED ORDER — ONDANSETRON HCL 4 MG/2ML IJ SOLN
INTRAMUSCULAR | Status: DC | PRN
  Administered 2016-03-22: 4 mg via INTRAVENOUS

## 2016-03-22 MED ORDER — EPHEDRINE SULFATE 50 MG/ML IJ SOLN
INTRAMUSCULAR | Status: DC | PRN
  Administered 2016-03-22: 10 mg via INTRAVENOUS

## 2016-03-22 MED ORDER — BUPIVACAINE-EPINEPHRINE (PF) 0.5% -1:200000 IJ SOLN
INTRAMUSCULAR | Status: AC
  Filled 2016-03-22: qty 10

## 2016-03-22 MED ORDER — HYDROCODONE-ACETAMINOPHEN 5-325 MG PO TABS
1.0000 | ORAL_TABLET | ORAL | Status: DC | PRN
  Administered 2016-03-22: 1 via ORAL

## 2016-03-22 MED ORDER — FENTANYL CITRATE (PF) 100 MCG/2ML IJ SOLN
INTRAMUSCULAR | Status: DC | PRN
  Administered 2016-03-22: 50 ug via INTRAVENOUS

## 2016-03-22 MED ORDER — ACETAMINOPHEN 10 MG/ML IV SOLN
INTRAVENOUS | Status: AC
  Filled 2016-03-22: qty 100

## 2016-03-22 MED ORDER — BUPIVACAINE-EPINEPHRINE (PF) 0.5% -1:200000 IJ SOLN
INTRAMUSCULAR | Status: DC | PRN
  Administered 2016-03-22: 30 mL

## 2016-03-22 MED ORDER — BUPIVACAINE-EPINEPHRINE (PF) 0.5% -1:200000 IJ SOLN
INTRAMUSCULAR | Status: AC
  Filled 2016-03-22: qty 20

## 2016-03-22 MED ORDER — PHENYLEPHRINE HCL 10 MG/ML IJ SOLN
INTRAMUSCULAR | Status: DC | PRN
  Administered 2016-03-22 (×6): 100 ug via INTRAVENOUS

## 2016-03-22 MED ORDER — MIDAZOLAM HCL 2 MG/2ML IJ SOLN
INTRAMUSCULAR | Status: DC | PRN
  Administered 2016-03-22: 2 mg via INTRAVENOUS

## 2016-03-22 MED ORDER — ACETAMINOPHEN 10 MG/ML IV SOLN
INTRAVENOUS | Status: DC | PRN
  Administered 2016-03-22: 1000 mg via INTRAVENOUS

## 2016-03-22 MED ORDER — FENTANYL CITRATE (PF) 100 MCG/2ML IJ SOLN
INTRAMUSCULAR | Status: AC
  Administered 2016-03-22: 25 ug via INTRAVENOUS
  Filled 2016-03-22: qty 2

## 2016-03-22 MED ORDER — LIDOCAINE HCL (CARDIAC) 20 MG/ML IV SOLN
INTRAVENOUS | Status: DC | PRN
  Administered 2016-03-22: 100 mg via INTRAVENOUS

## 2016-03-22 MED ORDER — PROPOFOL 10 MG/ML IV BOLUS
INTRAVENOUS | Status: DC | PRN
  Administered 2016-03-22: 180 mg via INTRAVENOUS

## 2016-03-22 MED ORDER — BUPIVACAINE-EPINEPHRINE (PF) 0.25% -1:200000 IJ SOLN
INTRAMUSCULAR | Status: AC
  Filled 2016-03-22: qty 30

## 2016-03-22 SURGICAL SUPPLY — 52 items
BANDAGE ELASTIC 6 LF NS (GAUZE/BANDAGES/DRESSINGS) ×3 IMPLANT
BLADE SURG 15 STRL SS SAFETY (BLADE) ×6 IMPLANT
BNDG GAUZE 4.5X4.1 6PLY STRL (MISCELLANEOUS) ×3 IMPLANT
BRA SURGICAL XLRG (MISCELLANEOUS) ×3 IMPLANT
BULB RESERV EVAC DRAIN JP 100C (MISCELLANEOUS) IMPLANT
CANISTER SUCT 1200ML W/VALVE (MISCELLANEOUS) ×3 IMPLANT
CHLORAPREP W/TINT 26ML (MISCELLANEOUS) ×3 IMPLANT
CLOSURE WOUND 1/2 X4 (GAUZE/BANDAGES/DRESSINGS) ×1
CNTNR SPEC 2.5X3XGRAD LEK (MISCELLANEOUS) ×1
CONT SPEC 4OZ STER OR WHT (MISCELLANEOUS) ×2
CONTAINER SPEC 2.5X3XGRAD LEK (MISCELLANEOUS) ×1 IMPLANT
COVER PROBE FLX POLY STRL (MISCELLANEOUS) ×3 IMPLANT
DEVICE DUBIN SPECIMEN MAMMOGRA (MISCELLANEOUS) ×3 IMPLANT
DRAIN CHANNEL JP 15F RND 16 (MISCELLANEOUS) IMPLANT
DRAPE LAPAROTOMY TRNSV 106X77 (MISCELLANEOUS) ×3 IMPLANT
DRSG TELFA 3X8 NADH (GAUZE/BANDAGES/DRESSINGS) ×3 IMPLANT
ELECT CAUTERY BLADE TIP 2.5 (TIP) ×3
ELECT REM PT RETURN 9FT ADLT (ELECTROSURGICAL) ×3
ELECTRODE CAUTERY BLDE TIP 2.5 (TIP) ×1 IMPLANT
ELECTRODE REM PT RTRN 9FT ADLT (ELECTROSURGICAL) ×1 IMPLANT
GAUZE FLUFF 18X24 1PLY STRL (GAUZE/BANDAGES/DRESSINGS) ×3 IMPLANT
GAUZE SPONGE 4X4 12PLY STRL (GAUZE/BANDAGES/DRESSINGS) ×3 IMPLANT
GLOVE BIO SURGEON STRL SZ7.5 (GLOVE) ×3 IMPLANT
GLOVE INDICATOR 8.0 STRL GRN (GLOVE) ×3 IMPLANT
GOWN STRL REUS W/ TWL LRG LVL3 (GOWN DISPOSABLE) ×2 IMPLANT
GOWN STRL REUS W/TWL LRG LVL3 (GOWN DISPOSABLE) ×4
HARMONIC SCALPEL FOCUS (MISCELLANEOUS) ×3 IMPLANT
KIT RM TURNOVER STRD PROC AR (KITS) ×3 IMPLANT
LABEL OR SOLS (LABEL) ×3 IMPLANT
MARGIN MAP 10MM (MISCELLANEOUS) ×3 IMPLANT
NDL SAFETY 22GX1.5 (NEEDLE) ×3 IMPLANT
NEEDLE HYPO 25X1 1.5 SAFETY (NEEDLE) ×6 IMPLANT
PACK BASIN MINOR ARMC (MISCELLANEOUS) ×3 IMPLANT
SHEARS FOC LG CVD HARMONIC 17C (MISCELLANEOUS) IMPLANT
SLEVE PROBE SENORX GAMMA FIND (MISCELLANEOUS) ×3 IMPLANT
STRIP CLOSURE SKIN 1/2X4 (GAUZE/BANDAGES/DRESSINGS) ×2 IMPLANT
SUT ETHILON 3-0 FS-10 30 BLK (SUTURE) ×3
SUT SILK 2 0 (SUTURE) ×2
SUT SILK 2-0 18XBRD TIE 12 (SUTURE) ×1 IMPLANT
SUT VIC AB 2-0 CT1 27 (SUTURE) ×4
SUT VIC AB 2-0 CT1 TAPERPNT 27 (SUTURE) ×2 IMPLANT
SUT VIC AB 3-0 SH 27 (SUTURE) ×4
SUT VIC AB 3-0 SH 27X BRD (SUTURE) ×2 IMPLANT
SUT VIC AB 4-0 FS2 27 (SUTURE) ×6 IMPLANT
SUT VICRYL+ 3-0 144IN (SUTURE) ×3 IMPLANT
SUTURE EHLN 3-0 FS-10 30 BLK (SUTURE) ×1 IMPLANT
SWABSTK COMLB BENZOIN TINCTURE (MISCELLANEOUS) ×3 IMPLANT
SYR BULB IRRIG 60ML STRL (SYRINGE) ×3 IMPLANT
SYR CONTROL 10ML (SYRINGE) ×3 IMPLANT
SYRINGE 10CC LL (SYRINGE) ×3 IMPLANT
TAPE TRANSPORE STRL 2 31045 (GAUZE/BANDAGES/DRESSINGS) ×3 IMPLANT
WATER STERILE IRR 1000ML POUR (IV SOLUTION) ×3 IMPLANT

## 2016-03-22 NOTE — Progress Notes (Signed)
  Oncology Nurse Navigator Documentation  Navigator Location: [facility] (03/22/16 1200) Navigator Encounter Type: Treatment (03/22/16 1200)       Surgery Date: 03/22/16 (03/22/16 1200) Treatment Initiated Date: 03/22/16 (03/22/16 1200) Patient Visit Type: Surgery (03/22/16 1200)                              Time Spent with Patient: 30 (03/22/16 1200)   Met patient, husband, and family prior to surgery.  Patient states she was able to find answers to insurance concerns. Will phone tomorrow as post-op follow-up.

## 2016-03-22 NOTE — Discharge Instructions (Signed)
AMBULATORY SURGERY  °DISCHARGE INSTRUCTIONS ° ° °1) The drugs that you were given will stay in your system until tomorrow so for the next 24 hours you should not: ° °A) Drive an automobile °B) Make any legal decisions °C) Drink any alcoholic beverage ° ° °2) You may resume regular meals tomorrow.  Today it is better to start with liquids and gradually work up to solid foods. ° °You may eat anything you prefer, but it is better to start with liquids, then soup and crackers, and gradually work up to solid foods. ° ° °3) Please notify your doctor immediately if you have any unusual bleeding, trouble breathing, redness and pain at the surgery site, drainage, fever, or pain not relieved by medication. ° ° ° °4) Additional Instructions: ° ° ° ° ° ° ° °Please contact your physician with any problems or Same Day Surgery at [phone], Monday through Friday 6 am to 4 pm, or [place] [facility] [place] Main number at [phone]. °

## 2016-03-22 NOTE — Anesthesia Procedure Notes (Signed)
Procedure Name: LMA Insertion Date/Time: 03/22/2016 10:42 AM Performed by: Aline Brochure Pre-anesthesia Checklist: Patient identified, Emergency Drugs available, Suction available and Patient being monitored Patient Re-evaluated:Patient Re-evaluated prior to inductionOxygen Delivery Method: Circle system utilized Preoxygenation: Pre-oxygenation with 100% oxygen Intubation Type: IV induction Ventilation: Mask ventilation without difficulty LMA: LMA inserted LMA Size: 3.5 Number of attempts: 1 Placement Confirmation: positive ETCO2 and breath sounds checked- equal and bilateral Tube secured with: Tape Dental Injury: Teeth and Oropharynx as per pre-operative assessment

## 2016-03-22 NOTE — Anesthesia Preprocedure Evaluation (Signed)
Anesthesia Evaluation  Patient identified by MRN, date of birth, ID band Patient awake    Reviewed: Allergy & Precautions, NPO status , Patient's Chart, lab work & pertinent test results  History of Anesthesia Complications Negative for: history of anesthetic complications  Airway Mallampati: III  TM Distance: <3 FB     Dental  (+) Teeth Intact   Pulmonary COPD, Current Smoker,     + decreased breath sounds      Cardiovascular Exercise Tolerance: Good hypertension, Pt. on medications  Rhythm:Regular Rate:Normal     Neuro/Psych    GI/Hepatic negative GI ROS, Neg liver ROS,   Endo/Other  negative endocrine ROS  Renal/GU negative Renal ROS     Musculoskeletal   Abdominal Normal abdominal exam  (+)   Peds  Hematology negative hematology ROS (+)   Anesthesia Other Findings   Reproductive/Obstetrics                             Anesthesia Physical Anesthesia Plan  ASA: II  Anesthesia Plan: General   Post-op Pain Management:    Induction: Intravenous  Airway Management Planned: LMA  Additional Equipment:   Intra-op Plan:   Post-operative Plan: Extubation in OR  Informed Consent: I have reviewed the patients History and Physical, chart, labs and discussed the procedure including the risks, benefits and alternatives for the proposed anesthesia with the patient or authorized representative who has indicated his/her understanding and acceptance.     Plan Discussed with: CRNA  Anesthesia Plan Comments:         Anesthesia Quick Evaluation

## 2016-03-22 NOTE — Op Note (Signed)
Preoperative diagnosis: Left breast cancer.  Postoperative diagnosis: Same.  Operative procedure: Left breast wide excision with ultrasound guidance, sentinel node biopsy.  Operating surgeon: Ollen Bowl, M.D.  Anesthesia: Gen. by LMA, Marcaine 0.5% with 1-200,000 epinephrine, 30 mL.  Estimated blood loss: Less than 5 mL.  Clinical note: This 65 year old woman had an abnormal mammogram and core biopsy showed evidence of invasive mammary carcinoma. She desired breast conservation. She was injected with technetium sulfur colloid prior to the procedure.  Operative note: With the patient under adequate general anesthesia 5 mL of 0.5% methylene blue was injected subareolar plexus. The breast was then prepped with ChloraPrep and draped. Ultrasound was used to confirm location of the previous biopsy site in the upper outer quadrant of the left breast approximate 5-6 cm from the nipple in the 2:00 position. Local anesthesia was infiltrated. The skin was incised sharply and the remaining dissection completed with electrocautery. The adipose tissue was divided and a 4 by 4 x 4 centimeters block of tissue was excised and sent for specimen radiograph. This showed the clip at the medial aspect of the block of tissue, about 5 mm from the medial border. The pathologist reported that the biopsy cavity extended close to within a millimeter of the anterior border. It was elected not to resect additional tissue at this time. While the wide excision site was being processed attention was turned to the axilla. Incredibly delightfully high counts were noted up to 5000. A transverse incision was made in the axilla after installation of local anesthetic. The skin was incised sharply and the remaining dissection completed with electrocautery. A single hot blue lymph node with multiple lymphatics entering it was identified and sent for for routine histology. An adjacent node was entirely cold. Scanning through the axilla  after the first node was removed showed over 50.  The axillary wound was closed with interrupted 2-0 Vicryl sutures. The skin was closed with a running 4-0 Vicryl subcuticular suture.  The wide excision site was closed with multiple layers of 2-0 Vicryl figure-of-eight sutures. The deep adipose tissue is treated in a similar fashion. The skin was approximated with a running 4-0 Vicryl subcuticular suture. Benzoin, Steri-Strips and Telfa dressings were applied to both surgical sites. Fluff gauze and a surgical bra was then placed.  The patient tolerated the procedure well and was taken to recovery in stable condition.

## 2016-03-22 NOTE — Transfer of Care (Signed)
Immediate Anesthesia Transfer of Care Note  Patient: Karen Phillips  Procedure(s) Performed: Procedure(s): BREAST LUMPECTOMY WITH SENTINEL LYMPH NODE BX (Left)  Patient Location: PACU  Anesthesia Type:General  Level of Consciousness: awake, alert  and oriented  Airway & Oxygen Therapy: Patient Spontanous Breathing and Patient connected to face mask oxygen  Post-op Assessment: Report given to RN and Post -op Vital signs reviewed and stable  Post vital signs: stable  Last Vitals:  Vitals:   03/22/16 0828 03/22/16 1149  BP: 126/65 119/68  Pulse: 80 100  Resp: 18 15  Temp: 36.9 C 37.4 C    Last Pain:  Vitals:   03/22/16 1149  TempSrc: Temporal         Complications: No apparent anesthesia complications

## 2016-03-22 NOTE — OR Nursing (Signed)
Patient tolerating po fluids and crackers, pt states pain tolerable and desires to go home.

## 2016-03-22 NOTE — Anesthesia Postprocedure Evaluation (Signed)
Anesthesia Post Note  Patient: Karen Phillips  Procedure(s) Performed: Procedure(s) (LRB): BREAST LUMPECTOMY WITH SENTINEL LYMPH NODE BX (Left)  Patient location during evaluation: PACU Anesthesia Type: General Level of consciousness: awake Pain management: pain level controlled Vital Signs Assessment: post-procedure vital signs reviewed and stable Respiratory status: spontaneous breathing Cardiovascular status: stable Anesthetic complications: no    Last Vitals:  Vitals:   03/22/16 1218 03/22/16 1228  BP: 118/69 103/61  Pulse: 86 86  Resp: 12 14  Temp:  37.3 C    Last Pain:  Vitals:   03/22/16 1218  TempSrc:   PainSc: 3                  VAN STAVEREN,Makila Colombe

## 2016-03-22 NOTE — H&P (Signed)
No change in clinical history or exam. For left breast wide excision and SLN biopsy.  

## 2016-03-23 ENCOUNTER — Encounter: Payer: Self-pay | Admitting: General Surgery

## 2016-03-23 NOTE — Progress Notes (Signed)
  Oncology Nurse Navigator Documentation  Navigator Location: [facility] (03/23/16 1100)   )Navigator Encounter Type: Telephone (03/23/16 1100) Telephone: Lahoma Crocker Call;Appt Confirmation/Clarification (post-op follow-up) (03/23/16 1100)                   Patient Visit Type: Follow-up (03/23/16 1100)                              Time Spent with Patient: 15 (03/23/16 1100)   Pateint reports doing well, and slept surprisingly well last night.  Has follow-up with Dr. Bary Castilla next week, and with Dr. Grayland Ormond on 04/04/16.

## 2016-03-24 LAB — SURGICAL PATHOLOGY

## 2016-03-25 ENCOUNTER — Telehealth: Payer: Self-pay | Admitting: General Surgery

## 2016-03-25 NOTE — Telephone Encounter (Signed)
Patient notified of results.  Reports no pain.  Follow-up as scheduled next week.

## 2016-03-29 ENCOUNTER — Encounter: Payer: Self-pay | Admitting: General Surgery

## 2016-03-29 ENCOUNTER — Inpatient Hospital Stay (INDEPENDENT_AMBULATORY_CARE_PROVIDER_SITE_OTHER)

## 2016-03-29 ENCOUNTER — Ambulatory Visit: Admitting: General Surgery

## 2016-03-29 VITALS — BP 130/72 | HR 74 | Resp 12 | Ht 66.0 in | Wt 201.0 lb

## 2016-03-29 DIAGNOSIS — C50412 Malignant neoplasm of upper-outer quadrant of left female breast: Secondary | ICD-10-CM | POA: Diagnosis not present

## 2016-03-29 NOTE — Progress Notes (Signed)
Patient [name], female   DOB: 03-Jul-1950, 65 y.o.   MRN: [id number]  Chief Complaint  Patient presents with  . Routine Post Op    left lumpectomy    HPI Karen Phillips is a 65 y.o. female here today for her left lumpectomy done on 03/22/16. Patient states she is doing well.  HPI  Past Medical History:  Diagnosis Date  . Arthritis   . Breast cancer ([place]) 02/15/2016   INVASIVE MAMMARY CARCINOMA  . Cataract   . Hemorrhoids   . Hyperlipidemia   . Hypertension   . Osteoporosis     Past Surgical History:  Procedure Laterality Date  . BREAST BIOPSY Left 02/15/2016   INVASIVE MAMMARY CARCINOMA  . BREAST LUMPECTOMY WITH SENTINEL LYMPH NODE BIOPSY Left 03/22/2016   Procedure: BREAST LUMPECTOMY WITH SENTINEL LYMPH NODE BX;  Surgeon: Robert Bellow, MD;  Location: [facility] ORS;  Service: General;  Laterality: Left;  . COLONOSCOPY  2016    Family History  Problem Relation Age of Onset  . Colon cancer Mother   . Breast cancer Maternal Aunt   . Prostate cancer Father     Social History Social History  Substance Use Topics  . Smoking status: Current Every Day Smoker    Packs/day: 1.00    Years: 40.00    Types: Cigarettes  . Smokeless tobacco: Never Used  . Alcohol use 1.2 oz/week    2 Glasses of wine per week    Allergies  Allergen Reactions  . Ibuprofen Swelling  . Sulfamethizole Rash    Other reaction(s): UNKNOWN    Current Outpatient Prescriptions  Medication Sig Dispense Refill  . atorvastatin (LIPITOR) 20 MG tablet Take 20 mg by mouth daily at 6 PM.     . calcium carbonate (OSCAL) 1500 (600 Ca) MG TABS tablet Take 1,500 mg by mouth daily with breakfast.    . folic acid (FOLVITE) 1 MG tablet Take 1 mg by mouth daily.    Marland Kitchen HYDROcodone-acetaminophen (NORCO) 5-325 MG tablet Take 1-2 tablets by mouth every 4 (four) hours as needed for moderate pain. 30 tablet 0  . losartan-hydrochlorothiazide (HYZAAR) 50-12.5 MG tablet Take 1 tablet by mouth daily.      . Multiple Vitamin (MULTIVITAMIN) tablet Take 1 tablet by mouth daily.    . naproxen (NAPROSYN) 500 MG tablet Take 500 mg by mouth 2 (two) times daily with a meal.    . ranitidine (ZANTAC) 150 MG tablet Take 150 mg by mouth daily.      No current facility-administered medications for this visit.     Review of Systems Review of Systems  Constitutional: Negative.   Respiratory: Negative.   Cardiovascular: Negative.     Blood pressure 130/72, pulse 74, resp. rate 12, height 5\' 6"  (1.676 m), weight 201 lb (91.2 kg).  Physical Exam Physical Exam  Constitutional: She is oriented to person, place, and time. She appears well-developed and well-nourished.  Cardiovascular: Normal rate, regular rhythm and normal heart sounds.   Pulmonary/Chest: Effort normal and breath sounds normal.    Neurological: She is alert and oriented to person, place, and time.  Skin: Skin is warm and dry.    Data Reviewed Ultrasound examination was undertaken to assess the candidacy for accelerated partial breast radiation. In the upper-outer quadrant of the left breast there is a 1.4 x 2.3 x 3.4 cm cavity at a minimal distance of 2.29 cm from the skin. Adequate for MammoSite balloon placement.  Assessment  Doing well status post wide excision and sentinel node biopsy for stage I carcinoma the left breast.    Plan    The patient's tumor statistics are very favorable. She will discuss with Dr. Grayland Ormond at her upcoming appointment on 04/04/2016 about the advisability of Mammoprint testing.  Arrangements have been made for evaluation by Noreene Filbert, M.D. from radiation oncology regarding radiation options.    Follow up appointment to be announced. This information has been scribed by Gaspar Cola CMA.   Robert Bellow 03/30/2016, 8:45 PM

## 2016-04-03 NOTE — Progress Notes (Signed)
[place]  [phone] [phone]  [name] OB: March 12, 1951  MR#: [id number]  HDQ#:[id number]  Patient Care Team: Peggyann Juba, NP as PCP - General Peggyann Juba, NP as Nurse Practitioner Robert Bellow, MD (General Surgery)  CHIEF COMPLAINT: Pathologic stage IA ER/PR positive, HER-2 negative invasive carcinoma of the upper outer quadrant of the left breast.  INTERVAL HISTORY: Patient returns to clinic today to discuss her final pathology results and treatment planning. She currently feels well and is asymptomatic. She has no neurologic complaints. She denies any recent fevers or illnesses. She has a good appetite and denies weight loss. She has no chest pain or shortness of breath. She denies any nausea, vomiting, constipation, or diarrhea. She has no urinary complaints. Patient feels at her baseline and offers no specific complaints today.  REVIEW OF SYSTEMS:   Review of Systems  Constitutional: Negative.  Negative for fever, malaise/fatigue and weight loss.  Respiratory: Negative.  Negative for cough and shortness of breath.   Cardiovascular: Negative.  Negative for chest pain.  Gastrointestinal: Negative for abdominal pain.  Genitourinary: Negative.   Musculoskeletal: Negative.   Neurological: Negative.  Negative for weakness.  Psychiatric/Behavioral: Negative.  The patient is not nervous/anxious.    As per HPI. Otherwise, a complete review of systems is negative.   PAST MEDICAL HISTORY: Past Medical History:  Diagnosis Date  . Arthritis   . Breast cancer ([place]) 02/15/2016   INVASIVE MAMMARY CARCINOMA  . Cataract   . Hemorrhoids   . Hyperlipidemia   . Hypertension   . Osteoporosis     PAST SURGICAL HISTORY: Past Surgical History:  Procedure Laterality Date  . BREAST BIOPSY Left 02/15/2016   INVASIVE MAMMARY CARCINOMA  . BREAST LUMPECTOMY WITH SENTINEL LYMPH NODE BIOPSY Left 03/22/2016   Procedure: BREAST  LUMPECTOMY WITH SENTINEL LYMPH NODE BX;  Surgeon: Robert Bellow, MD;  Location: [facility] ORS;  Service: General;  Laterality: Left;  . COLONOSCOPY  2016    FAMILY HISTORY: Family History  Problem Relation Age of Onset  . Colon cancer Mother   . Breast cancer Maternal Aunt   . Prostate cancer Father     ADVANCED DIRECTIVES (Y/N):  N  HEALTH MAINTENANCE: Social History  Substance Use Topics  . Smoking status: Current Every Day Smoker    Packs/day: 1.00    Years: 40.00    Types: Cigarettes  . Smokeless tobacco: Never Used  . Alcohol use 1.2 oz/week    2 Glasses of wine per week     Colonoscopy:  PAP:  Bone density:  Lipid panel:  Allergies  Allergen Reactions  . Ibuprofen Swelling  . Sulfamethizole Rash    Other reaction(s): UNKNOWN    Current Outpatient Prescriptions  Medication Sig Dispense Refill  . atorvastatin (LIPITOR) 20 MG tablet Take 20 mg by mouth daily at 6 PM.     . calcium carbonate (OSCAL) 1500 (600 Ca) MG TABS tablet Take 1,500 mg by mouth daily with breakfast.    . folic acid (FOLVITE) 1 MG tablet Take 1 mg by mouth daily.    Marland Kitchen HYDROcodone-acetaminophen (NORCO) 5-325 MG tablet Take 1-2 tablets by mouth every 4 (four) hours as needed for moderate pain. (Patient not taking: Reported on 04/04/2016) 30 tablet 0  . letrozole (FEMARA) 2.5 MG tablet Take 1 tablet (2.5 mg total) by mouth daily. 90 tablet 3  . losartan-hydrochlorothiazide (HYZAAR) 50-12.5 MG tablet Take 1 tablet by mouth daily.     Marland Kitchen  Multiple Vitamin (MULTIVITAMIN) tablet Take 1 tablet by mouth daily.    . naproxen (NAPROSYN) 500 MG tablet Take 500 mg by mouth 2 (two) times daily with a meal.    . ranitidine (ZANTAC) 150 MG tablet Take 150 mg by mouth daily.      No current facility-administered medications for this visit.     OBJECTIVE: There were no vitals filed for this visit.   There is no height or weight on file to calculate BMI.    ECOG FS:0 - Asymptomatic  General: Well-developed,  well-nourished, no acute distress. Eyes: Pink conjunctiva, anicteric sclera. Breasts: Patient had breast exam yesterday with radiation oncology and requested exam be deferred today. Lungs: Clear to auscultation bilaterally. Heart: Regular rate and rhythm. No rubs, murmurs, or gallops. Abdomen: Soft, nontender, nondistended. No organomegaly noted, normoactive bowel sounds. Musculoskeletal: No edema, cyanosis, or clubbing. Neuro: Alert, answering all questions appropriately. Cranial nerves grossly intact. Skin: No rashes or petechiae noted. Psych: Normal affect.   LAB RESULTS:  Lab Results  Component Value Date   NA 141 03/18/2016   K 4.0 03/18/2016   CL 107 03/18/2016   CO2 24 03/18/2016   GLUCOSE 103 (H) 03/18/2016   BUN 21 (H) 03/18/2016   CREATININE 0.72 03/18/2016   CALCIUM 9.5 03/18/2016   GFRNONAA >60 03/18/2016   GFRAA >60 03/18/2016    Lab Results  Component Value Date   HGB 13.7 03/18/2016     STUDIES: Ct Hip Left Wo Contrast  Result Date: 03/08/2016 CLINICAL DATA:  Left hip pain for 1 year. Rheumatoid arthritis. Outside radiographs were reportedly abnormal. EXAM: CT OF THE LEFT HIP WITHOUT CONTRAST TECHNIQUE: Multidetector CT imaging of the left hip was performed according to the standard protocol. Multiplanar CT image reconstructions were also generated. COMPARISON:  None. FINDINGS: Bones/Joint/Cartilage Severe craniocaudad and axial loss of articular space in the left hip with prominent spurring of the acetabulum and femoral head. Subcortical lucencies along the acetabulum are striking and surrounded by sclerosis, and probably from erosions, less likely geodes or degenerative subcortical cysts. Smaller and few or subcortical lucencies are present along the left femoral head. No CT evidence of avascular necrosis of the left hip. I do not observe an acute fracture. There is potentially a very small hip joint effusion although this is questionable. I do not observe  regional bursitis. Ligaments Suboptimally assessed by CT. Muscles and Tendons Unremarkable Soft tissues Sigmoid colon diverticulosis. Upper normal size left external iliac node at 9 mm diameter. Atherosclerotic calcification of the left common femoral artery. IMPRESSION: 1. Severe arthropathy of the left hip including prominent spurring the, and striking subcortical lucencies in the acetabulum favoring erosions or less likely degenerative subcortical cysts/geodes. Prominent loss of articular cartilage with axially and craniocaudad. Appearance compatible with rheumatoid arthropathy. 2. Sigmoid colon diverticulosis.  Atherosclerosis. Electronically Signed   By: Van Clines M.D.   On: 03/08/2016 08:13   Nm Sentinel Node Injection  Result Date: 03/22/2016 CLINICAL DATA:  Left breast cancer. EXAM: NUCLEAR MEDICINE BREAST LYMPHOSCINTIGRAPHY TECHNIQUE: The skin was prepped with chlorhexidine. 1% lidocaine was used for local anesthetic. Intradermal injection of radiopharmaceutical was performed at the 3 o'clock position around the left nipple. The patient was then sent to the operating room where the sentinel node(s) were identified and removed by the surgeon. RADIOPHARMACEUTICALS:  Total of 1 mCi Millipore-filtered Technetium-57msulfur colloid. IMPRESSION: Uncomplicated intradermal injection of a total of 1 mCi Technetium-943mulfur colloid for purposes of sentinel node identification. Electronically Signed  By: Aletta Edouard M.D.   On: 03/22/2016 12:41   US Breast Complete Uni Left Inc Axilla  Result Date: 03/30/2016 Ultrasound examination was undertaken to assess the candidacy for accelerated partial breast radiation. In the upper-outer quadrant of the left breast there is a 1.4 x 2.3 x 3.4 cm cavity at a minimal distance of 2.29 cm from the skin. Adequate for MammoSite balloon placement.    ASSESSMENT: Pathologic stage IA ER/PR positive, HER-2 negative invasive carcinoma of the upper outer  quadrant of the left breast. Low risk MammoPrint.  PLAN:    1. Pathologic stage IA ER/PR positive, HER-2 negative invasive carcinoma of the upper outer quadrant of the left breast: Patient has now completed her lumpectomy with no change in the staging of her disease. She is also low MammoPrint therefore does not require adjuvant chemotherapy. Patient reports she is undergoing MammoSite radiation in the next 1-2 weeks. She was given a prescription for letrozole today which she will be required to take for 5 years completing in November 2022. Return to clinic in 3 months for routine evaluation. 2. Osteopenia: Patient had a DEXA scan in March 2016 with a T score of -1.8. Will repeat in the next 1-2 weeks.    Patient expressed understanding and was in agreement with this plan. She also understands that She can call clinic at any time with any questions, concerns, or complaints.   Breast cancer of upper-outer quadrant of left female breast [facility])   Staging form: Breast, AJCC 7th Edition   - Pathologic stage from 04/04/2016: Stage IA (T1b, N0, cM0) - Signed by Lloyd Huger, MD on 04/04/2016  Lloyd Huger, MD   04/04/2016 11:11 AM

## 2016-04-04 ENCOUNTER — Inpatient Hospital Stay: Attending: Oncology | Admitting: Oncology

## 2016-04-04 ENCOUNTER — Ambulatory Visit
Admission: RE | Admit: 2016-04-04 | Discharge: 2016-04-04 | Disposition: A | Discharge status: Home / Self Care | Source: Ambulatory Visit | Attending: Radiation Oncology | Admitting: Radiation Oncology

## 2016-04-04 ENCOUNTER — Encounter: Payer: Self-pay | Admitting: Radiation Oncology

## 2016-04-04 VITALS — BP 133/88 | HR 105 | Temp 98.9°F | Resp 20 | Wt 206.2 lb

## 2016-04-04 DIAGNOSIS — M129 Arthropathy, unspecified: Secondary | ICD-10-CM | POA: Insufficient documentation

## 2016-04-04 DIAGNOSIS — F17210 Nicotine dependence, cigarettes, uncomplicated: Secondary | ICD-10-CM | POA: Insufficient documentation

## 2016-04-04 DIAGNOSIS — Z170 Estrogen receptor positive status [ER+]: Secondary | ICD-10-CM

## 2016-04-04 DIAGNOSIS — E785 Hyperlipidemia, unspecified: Secondary | ICD-10-CM | POA: Insufficient documentation

## 2016-04-04 DIAGNOSIS — C50412 Malignant neoplasm of upper-outer quadrant of left female breast: Secondary | ICD-10-CM | POA: Insufficient documentation

## 2016-04-04 DIAGNOSIS — M1990 Unspecified osteoarthritis, unspecified site: Secondary | ICD-10-CM | POA: Insufficient documentation

## 2016-04-04 DIAGNOSIS — M818 Other osteoporosis without current pathological fracture: Secondary | ICD-10-CM | POA: Diagnosis not present

## 2016-04-04 DIAGNOSIS — I10 Essential (primary) hypertension: Secondary | ICD-10-CM | POA: Insufficient documentation

## 2016-04-04 DIAGNOSIS — Z8042 Family history of malignant neoplasm of prostate: Secondary | ICD-10-CM | POA: Diagnosis not present

## 2016-04-04 DIAGNOSIS — Z800 Family history of malignant neoplasm of digestive organs: Secondary | ICD-10-CM | POA: Insufficient documentation

## 2016-04-04 DIAGNOSIS — Z79899 Other long term (current) drug therapy: Secondary | ICD-10-CM | POA: Insufficient documentation

## 2016-04-04 DIAGNOSIS — Z803 Family history of malignant neoplasm of breast: Secondary | ICD-10-CM | POA: Insufficient documentation

## 2016-04-04 DIAGNOSIS — M8580 Other specified disorders of bone density and structure, unspecified site: Secondary | ICD-10-CM | POA: Diagnosis not present

## 2016-04-04 MED ORDER — LETROZOLE 2.5 MG PO TABS
2.5000 mg | ORAL_TABLET | Freq: Every day | ORAL | 3 refills | Status: DC
Start: 2016-04-04 — End: 2017-03-20

## 2016-04-04 NOTE — Progress Notes (Signed)
States feeling well today. Surgery went well. Pt saw Dr. Oren Bracket this morning. Offers no complaints.

## 2016-04-04 NOTE — Consult Note (Signed)
NEW PATIENT EVALUATION  Name: Karen Phillips  MRN: [id number]  Date:   04/04/2016     DOB: Jan 23, 1951   This 65 y.o. female patient presents to the clinic for initial evaluation of stage I (T1 BN 0 M0) ER/PR positive HER-2/neu negative invasive mammary carcinoma. Of the left breast status post wide local excision and sentinel node biopsy  REFERRING PHYSICIAN: Peggyann Juba, NP  CHIEF COMPLAINT:  Chief Complaint  Patient presents with  . Breast Cancer    Pt is here for initial consultation of breast cancer.      DIAGNOSIS: The encounter diagnosis was Malignant neoplasm of upper-outer quadrant of left breast in female, estrogen receptor positive ([place]).   PREVIOUS INVESTIGATIONS:  Pathology reports reviewed Mammograms and ultrasound reviewed Clinical notes reviewed  HPI: Patient is a 65 year old female who presented with an abnormal mammogram of her left breast showing a 7 mm irregular mass at the 2:00 position 6 cm from the nipple. Lesion was also hypoechoic on ultrasound which prompted ultrasound-guided biopsy. Biopsy was positive for ER/PR positive HER-2/neu negative invasive mammary carcinoma. She underwent a wide local excision and sentinel node biopsy for a 6 mm invasive mammary carcinoma. Margins were negative at 1 mm to the closest anterior margin. One sentinel lymph node was negative for metastatic disease. Patient tolerated her surgery well his now seen for radiation oncology opinion. She is seen medical oncology and may have a MammaPrint performed. Patient is interested in accelerated partial breast irradiation.  PLANNED TREATMENT REGIMEN: Possible accelerated partial breast radiation to the left breast  PAST MEDICAL HISTORY:  has a past medical history of Arthritis; Breast cancer ([place]) (02/15/2016); Cataract; Hemorrhoids; Hyperlipidemia; Hypertension; and Osteoporosis.    PAST SURGICAL HISTORY:  Past Surgical History:  Procedure Laterality Date  . BREAST BIOPSY Left  02/15/2016   INVASIVE MAMMARY CARCINOMA  . BREAST LUMPECTOMY WITH SENTINEL LYMPH NODE BIOPSY Left 03/22/2016   Procedure: BREAST LUMPECTOMY WITH SENTINEL LYMPH NODE BX;  Surgeon: Robert Bellow, MD;  Location: [facility] ORS;  Service: General;  Laterality: Left;  . COLONOSCOPY  2016    FAMILY HISTORY: family history includes Breast cancer in her maternal aunt; Colon cancer in her mother; Prostate cancer in her father.  SOCIAL HISTORY:  reports that she has been smoking Cigarettes.  She has a 40.00 pack-year smoking history. She has never used smokeless tobacco. She reports that she drinks about 1.2 oz of alcohol per week . She reports that she does not use drugs.  ALLERGIES: Ibuprofen and Sulfamethizole  MEDICATIONS:  Current Outpatient Prescriptions  Medication Sig Dispense Refill  . atorvastatin (LIPITOR) 20 MG tablet Take 20 mg by mouth daily at 6 PM.     . calcium carbonate (OSCAL) 1500 (600 Ca) MG TABS tablet Take 1,500 mg by mouth daily with breakfast.    . folic acid (FOLVITE) 1 MG tablet Take 1 mg by mouth daily.    Marland Kitchen losartan-hydrochlorothiazide (HYZAAR) 50-12.5 MG tablet Take 1 tablet by mouth daily.     . Multiple Vitamin (MULTIVITAMIN) tablet Take 1 tablet by mouth daily.    . naproxen (NAPROSYN) 500 MG tablet Take 500 mg by mouth 2 (two) times daily with a meal.    . ranitidine (ZANTAC) 150 MG tablet Take 150 mg by mouth daily.     Marland Kitchen HYDROcodone-acetaminophen (NORCO) 5-325 MG tablet Take 1-2 tablets by mouth every 4 (four) hours as needed for moderate pain. (Patient not taking: Reported on 04/04/2016) 30 tablet 0  No current facility-administered medications for this encounter.     ECOG PERFORMANCE STATUS:  0 - Asymptomatic  REVIEW OF SYSTEMS:  Patient denies any weight loss, fatigue, weakness, fever, chills or night sweats. Patient denies any loss of vision, blurred vision. Patient denies any ringing  of the ears or hearing loss. No irregular heartbeat. Patient denies  heart murmur or history of fainting. Patient denies any chest pain or pain radiating to her upper extremities. Patient denies any shortness of breath, difficulty breathing at night, cough or hemoptysis. Patient denies any swelling in the lower legs. Patient denies any nausea vomiting, vomiting of blood, or coffee ground material in the vomitus. Patient denies any stomach pain. Patient states has had normal bowel movements no significant constipation or diarrhea. Patient denies any dysuria, hematuria or significant nocturia. Patient denies any problems walking, swelling in the joints or loss of balance. Patient denies any skin changes, loss of hair or loss of weight. Patient denies any excessive worrying or anxiety or significant depression. Patient denies any problems with insomnia. Patient denies excessive thirst, polyuria, polydipsia. Patient denies any swollen glands, patient denies easy bruising or easy bleeding. Patient denies any recent infections, allergies or URI. Patient "s visual fields have not changed significantly in recent time.    PHYSICAL EXAM: BP 133/88   Pulse (!) 105   Temp 98.9 F (37.2 C)   Resp 20   Wt 206 lb 3.9 oz (93.6 kg)   BMI 33.29 kg/m  Patient is status post wide local excision of the left breast which is healing well. No dominant mass or nodularity is noted in either breast in 2 positions examined. No axillary or supraclavicular adenopathy is appreciated. Well-developed well-nourished patient in NAD. HEENT reveals PERLA, EOMI, discs not visualized.  Oral cavity is clear. No oral mucosal lesions are identified. Neck is clear without evidence of cervical or supraclavicular adenopathy. Lungs are clear to A&P. Cardiac examination is essentially unremarkable with regular rate and rhythm without murmur rub or thrill. Abdomen is benign with no organomegaly or masses noted. Motor sensory and DTR levels are equal and symmetric in the upper and lower extremities. Cranial nerves II  through XII are grossly intact. Proprioception is intact. No peripheral adenopathy or edema is identified. No motor or sensory levels are noted. Crude visual fields are within normal range.  LABORATORY DATA: Pathology reports reviewed    RADIOLOGY RESULTS: Mammogram and ultrasound reviewed   IMPRESSION: Stage I ER/PR positive invasive mammary carcinoma of left breast status post wide local excision and sentinel node biopsy.  PLAN: Patient's had ultrasound of her lumpectomy site and seems to be an excellent candidate for accelerated partial breast irradiation. She is interested in that technique. I've gone over both whole breast radiation as well as partial breast radiation. Risks and benefits of both were discussed in detail. Side effects such as skin reaction fatigue placement of the MammoSite balloon catheter all were discussed in detail with the patient and her husband. We will coordinate with surgeon's office placement of her MammoSite balloon and set her up for treatment planning shortly thereafter. Patient also will be seeing medical oncology to discuss possibility of systemic chemotherapy although even if that is planned will be finished with her radiation prior to the initiation of systemic chemotherapy. Patient and her husband both seem to comprehend my treatment plan well.  I would like to take this opportunity to thank you for allowing me to participate in the care of your patient.Armstead Peaks.,  MD

## 2016-04-06 ENCOUNTER — Telehealth: Payer: Self-pay | Admitting: *Deleted

## 2016-04-06 MED ORDER — CEFADROXIL 500 MG PO CAPS: 500.0000 mg | ORAL_CAPSULE | Freq: Two times a day (BID) | ORAL | 0 refills | Status: DC

## 2016-04-06 NOTE — Telephone Encounter (Signed)
Mammosite schedule reviewed with the patient Placement  04-12-16   at [facility] at 8:15 Scan 04-14-16 Treat 11-10 to 11-16 Aware the [facility] will be calling her for more details Aware of ATB and directions reviewed. Aware no showers and to wear her bra while mammosite in place. Pt agrees.

## 2016-04-12 ENCOUNTER — Ambulatory Visit (INDEPENDENT_AMBULATORY_CARE_PROVIDER_SITE_OTHER): Admitting: General Surgery

## 2016-04-12 ENCOUNTER — Encounter: Payer: Self-pay | Admitting: General Surgery

## 2016-04-12 ENCOUNTER — Inpatient Hospital Stay: Payer: Self-pay

## 2016-04-12 VITALS — BP 134/72 | HR 88 | Resp 12 | Ht 67.0 in | Wt 205.0 lb

## 2016-04-12 DIAGNOSIS — C50412 Malignant neoplasm of upper-outer quadrant of left female breast: Secondary | ICD-10-CM

## 2016-04-12 NOTE — Progress Notes (Signed)
Patient [name], female   DOB: 09/07/1950, 64 y.o.   MRN: [id number]  Chief Complaint  Patient presents with  . Procedure    HPI Karen Phillips is a 64 y.o. female.  Here today for left mammosite placement.The patient has met with radiation oncology and has been felt to be an acceptable candidate for accelerated partial breast radiation. She is here with James Brim.  HPI  Past Medical History:  Diagnosis Date  . Arthritis   . Breast cancer ([facility]) 02/15/2016   INVASIVE MAMMARY CARCINOMA  . Cataract   . Hemorrhoids   . Hyperlipidemia   . Hypertension   . Osteoporosis     Past Surgical History:  Procedure Laterality Date  . BREAST BIOPSY Left 02/15/2016   INVASIVE MAMMARY CARCINOMA  . BREAST LUMPECTOMY WITH SENTINEL LYMPH NODE BIOPSY Left 03/22/2016   Procedure: BREAST LUMPECTOMY WITH SENTINEL LYMPH NODE BX;  Surgeon: Jeffrey W [name], MD;  Location: [facility] ORS;  Service: General;  Laterality: Left;  . COLONOSCOPY  2016    Family History  Problem Relation Age of Onset  . Colon cancer Mother   . Breast cancer Maternal Aunt   . Prostate cancer Father     Social History Social History  Substance Use Topics  . Smoking status: Current Every Day Smoker    Packs/day: 1.00    Years: 40.00    Types: Cigarettes  . Smokeless tobacco: Never Used  . Alcohol use 1.2 oz/week    2 Glasses of wine per week    Allergies  Allergen Reactions  . Ibuprofen Swelling  . Sulfamethizole Rash    Other reaction(s): UNKNOWN    Current Outpatient Prescriptions  Medication Sig Dispense Refill  . atorvastatin (LIPITOR) 20 MG tablet Take 20 mg by mouth daily at 6 PM.     . calcium carbonate (OSCAL) 1500 (600 Ca) MG TABS tablet Take 1,500 mg by mouth daily with breakfast.    . cefadroxil (DURICEF) 500 MG capsule Take 1 capsule (500 mg total) by mouth 2 (two) times daily. Start one hour prior to office procedure on 04-12-16 24 capsule 0  . folic acid (FOLVITE) 1 MG tablet  Take 1 mg by mouth daily.    . HYDROcodone-acetaminophen (NORCO) 5-325 MG tablet Take 1-2 tablets by mouth every 4 (four) hours as needed for moderate pain. 30 tablet 0  . letrozole (FEMARA) 2.5 MG tablet Take 1 tablet (2.5 mg total) by mouth daily. 90 tablet 3  . losartan-hydrochlorothiazide (HYZAAR) 50-12.5 MG tablet Take 1 tablet by mouth daily.     . Multiple Vitamin (MULTIVITAMIN) tablet Take 1 tablet by mouth daily.    . naproxen (NAPROSYN) 500 MG tablet Take 500 mg by mouth 2 (two) times daily with a meal.    . ranitidine (ZANTAC) 150 MG tablet Take 150 mg by mouth daily.     . hydroxychloroquine (PLAQUENIL) 200 MG tablet      No current facility-administered medications for this visit.     Review of Systems Review of Systems  Constitutional: Negative.   Respiratory: Negative.   Cardiovascular: Negative.     Blood pressure 134/72, pulse 88, resp. rate 12, height 5' 7" (1.702 m), weight 205 lb (93 kg).  Physical Exam Physical Exam  Constitutional: She is oriented to person, place, and time. She appears well-developed and well-nourished.  Pulmonary/Chest:    Neurological: She is alert and oriented to person, place, and time.  Skin: Skin is warm and dry.  Psychiatric:   Her behavior is normal.    Data Reviewed Ultrasound examination of the upper-outer quadrant of the left breast again shows a well-defined cavity was in 2 cm below the skin level. The area was cleansed with alcohol and a total of 10 mL of 0.5% Xylocaine with 0.25% Marcaine with 1-200,000 of epinephrine was utilized well tolerated. The area was reprepped with ChloraPrep and draped. A transverse incision was made lateral to the wide excision site and an 8 mm trocar used to drain the cavity. Clear yellow fluid noted, no odor. A cavity evaluation device was placed and inflated with 40 mL of saline. A spherical balloon was noted with a minimal distance to the skin of 2.04 cm.  The cavity evaluation device was removed  and a 4-5 cm MammoSite treatment balloon placed and inflated with 40 mL of saline mixed with Isovue contrast. Again, a spherical balloon inflation was noted. Minimal distance to the skin 2.10 cm. No evidence of residual fluid. The balloon exit site was treated with bacitracin ointment followed by a bulky dressing.  The patient tolerated the procedure well.  Assessment    Successful placement MammoSite balloon for accelerated partial breast radiation.    Plan    The patient will return in 2 weeks for post procedure assessment.     This information has been scribed by Marsha Hatch RN, BSN,BC.   [name], Jeffrey W 04/12/2016, 10:07 AM   

## 2016-04-12 NOTE — Patient Instructions (Signed)
Patient care kit given to patient.  Instructed no showers, sponge bath while mammosite in place, take antibiotic. Follow up with [facility] as arranged. Discussed wearing your bra for support at all times.

## 2016-04-13 ENCOUNTER — Ambulatory Visit
Admission: RE | Admit: 2016-04-13 | Discharge: 2016-04-13 | Disposition: A | Discharge status: Home / Self Care | Source: Ambulatory Visit | Attending: Oncology | Admitting: Oncology

## 2016-04-13 DIAGNOSIS — M8588 Other specified disorders of bone density and structure, other site: Secondary | ICD-10-CM | POA: Insufficient documentation

## 2016-04-13 DIAGNOSIS — Z8262 Family history of osteoporosis: Secondary | ICD-10-CM | POA: Diagnosis not present

## 2016-04-13 DIAGNOSIS — Z780 Asymptomatic menopausal state: Secondary | ICD-10-CM | POA: Insufficient documentation

## 2016-04-13 DIAGNOSIS — Z170 Estrogen receptor positive status [ER+]: Secondary | ICD-10-CM | POA: Insufficient documentation

## 2016-04-13 DIAGNOSIS — C50412 Malignant neoplasm of upper-outer quadrant of left female breast: Secondary | ICD-10-CM | POA: Diagnosis not present

## 2016-04-13 DIAGNOSIS — M85852 Other specified disorders of bone density and structure, left thigh: Secondary | ICD-10-CM | POA: Diagnosis not present

## 2016-04-14 ENCOUNTER — Ambulatory Visit
Admission: RE | Admit: 2016-04-14 | Discharge: 2016-04-14 | Disposition: A | Discharge status: Home / Self Care | Source: Ambulatory Visit | Attending: Radiation Oncology | Admitting: Radiation Oncology

## 2016-04-14 DIAGNOSIS — C50412 Malignant neoplasm of upper-outer quadrant of left female breast: Secondary | ICD-10-CM | POA: Diagnosis not present

## 2016-04-15 ENCOUNTER — Ambulatory Visit
Admission: RE | Admit: 2016-04-15 | Discharge: 2016-04-15 | Disposition: A | Discharge status: Home / Self Care | Source: Ambulatory Visit | Attending: Radiation Oncology | Admitting: Radiation Oncology

## 2016-04-15 DIAGNOSIS — C50412 Malignant neoplasm of upper-outer quadrant of left female breast: Secondary | ICD-10-CM | POA: Diagnosis not present

## 2016-04-18 ENCOUNTER — Ambulatory Visit
Admission: RE | Admit: 2016-04-18 | Discharge: 2016-04-18 | Disposition: A | Discharge status: Home / Self Care | Source: Ambulatory Visit | Attending: Radiation Oncology | Admitting: Radiation Oncology

## 2016-04-18 DIAGNOSIS — C50412 Malignant neoplasm of upper-outer quadrant of left female breast: Secondary | ICD-10-CM | POA: Diagnosis not present

## 2016-04-19 ENCOUNTER — Ambulatory Visit
Admission: RE | Admit: 2016-04-19 | Discharge: 2016-04-19 | Disposition: A | Discharge status: Home / Self Care | Source: Ambulatory Visit | Attending: Radiation Oncology | Admitting: Radiation Oncology

## 2016-04-19 ENCOUNTER — Telehealth: Payer: Self-pay | Admitting: *Deleted

## 2016-04-19 DIAGNOSIS — C50412 Malignant neoplasm of upper-outer quadrant of left female breast: Secondary | ICD-10-CM | POA: Diagnosis not present

## 2016-04-19 NOTE — Telephone Encounter (Signed)
Karen Phillips at [facility] called to let us know that the mammosite catheter had a slow leak at the blue port. They were able to add 4 ml to the balloon to obtain appropriate measurements.( Lowest volume was 38 ml.) They will continue to monitor. No need to change balloon at this time per Dr Baruch Gouty.

## 2016-04-20 ENCOUNTER — Ambulatory Visit
Admission: RE | Admit: 2016-04-20 | Discharge: 2016-04-20 | Disposition: A | Discharge status: Home / Self Care | Source: Ambulatory Visit | Attending: Radiation Oncology | Admitting: Radiation Oncology

## 2016-04-20 DIAGNOSIS — C50412 Malignant neoplasm of upper-outer quadrant of left female breast: Secondary | ICD-10-CM | POA: Diagnosis not present

## 2016-04-21 ENCOUNTER — Ambulatory Visit
Admission: RE | Admit: 2016-04-21 | Discharge: 2016-04-21 | Disposition: A | Discharge status: Home / Self Care | Source: Ambulatory Visit | Attending: Radiation Oncology | Admitting: Radiation Oncology

## 2016-04-21 DIAGNOSIS — C50412 Malignant neoplasm of upper-outer quadrant of left female breast: Secondary | ICD-10-CM | POA: Diagnosis not present

## 2016-04-22 ENCOUNTER — Telehealth: Payer: Self-pay

## 2016-04-22 ENCOUNTER — Encounter: Payer: Self-pay | Admitting: General Surgery

## 2016-04-22 ENCOUNTER — Encounter: Payer: Self-pay | Admitting: Oncology

## 2016-04-22 ENCOUNTER — Ambulatory Visit (INDEPENDENT_AMBULATORY_CARE_PROVIDER_SITE_OTHER): Admitting: General Surgery

## 2016-04-22 VITALS — BP 134/74 | HR 84 | Resp 12 | Ht 67.0 in | Wt 201.0 lb

## 2016-04-22 DIAGNOSIS — C50412 Malignant neoplasm of upper-outer quadrant of left female breast: Secondary | ICD-10-CM

## 2016-04-22 NOTE — Telephone Encounter (Signed)
Patient coming in today to be seen.

## 2016-04-22 NOTE — Telephone Encounter (Signed)
The patient called and states that she had her Mammosite balloon removed yesterday. She says that last night she had a lot of aching type pain from the area and did not sleep well. She states that today the pain is a little improved, but still there. She denies any redness, discharge, fevers, or chills. She is currently taking Aleve twice daily.

## 2016-04-22 NOTE — Patient Instructions (Signed)
Return in two weeks.  

## 2016-04-22 NOTE — Progress Notes (Signed)
Patient [name], female   DOB: [birth year], 65 y.o.   MRN: [id number]  Chief Complaint  Patient presents with  . Follow-up    mammosite area check    HPI Karen Phillips is a 65 y.o. female here today for mammosite check. states that she had her Mammosite balloon removed yesterday. She says that last night she had a lot of aching type pain from the area and did not sleep well. She states that today the pain is a little improved, but still there. She denies any redness, discharge, fevers, or chills. She is currently taking Aleve twice daily.  HPI  Past Medical History:  Diagnosis Date  . Arthritis   . Breast cancer ([place]) 02/15/2016   INVASIVE MAMMARY CARCINOMA  . Cataract   . Hemorrhoids   . Hyperlipidemia   . Hypertension   . Osteoporosis     Past Surgical History:  Procedure Laterality Date  . BREAST BIOPSY Left 02/15/2016   INVASIVE MAMMARY CARCINOMA  . BREAST LUMPECTOMY WITH SENTINEL LYMPH NODE BIOPSY Left 03/22/2016   Procedure: BREAST LUMPECTOMY WITH SENTINEL LYMPH NODE BX;  Surgeon: Robert Bellow, MD;  Location: [facility] ORS;  Service: General;  Laterality: Left;  . COLONOSCOPY  2016    Family History  Problem Relation Age of Onset  . Colon cancer Mother   . Breast cancer Maternal Aunt   . Prostate cancer Father     Social History Social History  Substance Use Topics  . Smoking status: Current Every Day Smoker    Packs/day: 1.00    Years: 40.00    Types: Cigarettes  . Smokeless tobacco: Never Used  . Alcohol use 1.2 oz/week    2 Glasses of wine per week    Allergies  Allergen Reactions  . Ibuprofen Swelling  . Sulfamethizole Rash    Other reaction(s): UNKNOWN    Current Outpatient Prescriptions  Medication Sig Dispense Refill  . atorvastatin (LIPITOR) 20 MG tablet Take 20 mg by mouth daily at 6 PM.     . calcium carbonate (OSCAL) 1500 (600 Ca) MG TABS tablet Take 1,500 mg by mouth daily with breakfast.    . cefadroxil (DURICEF) 500 MG  capsule Take 1 capsule (500 mg total) by mouth 2 (two) times daily. Start one hour prior to office procedure on 04-12-16 24 capsule 0  . folic acid (FOLVITE) 1 MG tablet Take 1 mg by mouth daily.    Marland Kitchen HYDROcodone-acetaminophen (NORCO) 5-325 MG tablet Take 1-2 tablets by mouth every 4 (four) hours as needed for moderate pain. 30 tablet 0  . hydroxychloroquine (PLAQUENIL) 200 MG tablet     . letrozole (FEMARA) 2.5 MG tablet Take 1 tablet (2.5 mg total) by mouth daily. 90 tablet 3  . losartan-hydrochlorothiazide (HYZAAR) 50-12.5 MG tablet Take 1 tablet by mouth daily.     . Multiple Vitamin (MULTIVITAMIN) tablet Take 1 tablet by mouth daily.    . naproxen (NAPROSYN) 500 MG tablet Take 500 mg by mouth 2 (two) times daily with a meal.    . ranitidine (ZANTAC) 150 MG tablet Take 150 mg by mouth daily.      No current facility-administered medications for this visit.     Review of Systems Review of Systems  Constitutional: Negative.   Respiratory: Negative.   Cardiovascular: Negative.     Blood pressure 134/74, pulse 84, resp. rate 12, height 5\' 7"  (1.702 m), weight 201 lb (91.2 kg).  Physical Exam Physical Exam  Constitutional: She is  oriented to person, place, and time. She appears well-developed and well-nourished.  Pulmonary/Chest:    Neurological: She is oriented to person, place, and time.  Skin: Skin is warm and dry.    Data Reviewed Dr. Grayland Ormond has provided the patient with her prescription for Femara, 2.5 mg daily.  Assessment    No evidence of post MammoSite complication.    Plan    The patient will start her Femara at any time. She was strongly encouraged to plan on taking this for a month, anticipating any vasomotor symptoms would resolve by the end of that timeframe.    Return in two weeks.  This information has been scribed by Gaspar Cola CMA.   Robert Bellow 04/22/2016, 4:31 PM

## 2016-04-26 ENCOUNTER — Ambulatory Visit: Admitting: General Surgery

## 2016-05-04 ENCOUNTER — Encounter: Payer: Self-pay | Admitting: General Surgery

## 2016-05-04 ENCOUNTER — Ambulatory Visit (INDEPENDENT_AMBULATORY_CARE_PROVIDER_SITE_OTHER): Admitting: General Surgery

## 2016-05-04 VITALS — BP 130/70 | HR 74 | Resp 12 | Ht 68.0 in | Wt 200.0 lb

## 2016-05-04 DIAGNOSIS — C50412 Malignant neoplasm of upper-outer quadrant of left female breast: Secondary | ICD-10-CM

## 2016-05-04 DIAGNOSIS — B029 Zoster without complications: Secondary | ICD-10-CM | POA: Insufficient documentation

## 2016-05-04 NOTE — Progress Notes (Signed)
Patient [name], female   DOB: 11/07/1950, 65 y.o.   MRN: [id number]  Chief Complaint  Patient presents with  . Follow-up    mammosite    HPI Karen Phillips is a 65 y.o. female here today for her follow up mammosite . Patient states she got shingles on 04/30/2016.  HPI  Past Medical History:  Diagnosis Date  . Arthritis   . Breast cancer ([place]) 02/15/2016   INVASIVE MAMMARY CARCINOMA  . Cataract   . Hemorrhoids   . Hyperlipidemia   . Hypertension   . Osteoporosis     Past Surgical History:  Procedure Laterality Date  . BREAST BIOPSY Left 02/15/2016   INVASIVE MAMMARY CARCINOMA  . BREAST LUMPECTOMY WITH SENTINEL LYMPH NODE BIOPSY Left 03/22/2016   Procedure: BREAST LUMPECTOMY WITH SENTINEL LYMPH NODE BX;  Surgeon: Robert Bellow, MD;  Location: [facility] ORS;  Service: General;  Laterality: Left;  . COLONOSCOPY  2016    Family History  Problem Relation Age of Onset  . Colon cancer Mother   . Breast cancer Maternal Aunt   . Prostate cancer Father     Social History Social History  Substance Use Topics  . Smoking status: Current Every Day Smoker    Packs/day: 1.00    Years: 40.00    Types: Cigarettes  . Smokeless tobacco: Never Used  . Alcohol use 1.2 oz/week    2 Glasses of wine per week    Allergies  Allergen Reactions  . Ibuprofen Swelling  . Sulfamethizole Rash    Other reaction(s): UNKNOWN    Current Outpatient Prescriptions  Medication Sig Dispense Refill  . atorvastatin (LIPITOR) 20 MG tablet Take 20 mg by mouth daily at 6 PM.     . calcium carbonate (OSCAL) 1500 (600 Ca) MG TABS tablet Take 1,500 mg by mouth daily with breakfast.    . cefadroxil (DURICEF) 500 MG capsule     . folic acid (FOLVITE) 1 MG tablet Take 1 mg by mouth daily.    Marland Kitchen gabapentin (NEURONTIN) 100 MG capsule Take 2 at bedtime and 1 at breakfast and lunch    . HYDROcodone-acetaminophen (NORCO) 5-325 MG tablet Take 1-2 tablets by mouth every 4 (four) hours as needed  for moderate pain. 30 tablet 0  . hydroxychloroquine (PLAQUENIL) 200 MG tablet     . letrozole (FEMARA) 2.5 MG tablet Take 1 tablet (2.5 mg total) by mouth daily. 90 tablet 3  . losartan-hydrochlorothiazide (HYZAAR) 50-12.5 MG tablet Take 1 tablet by mouth daily.     . Multiple Vitamin (MULTIVITAMIN) tablet Take 1 tablet by mouth daily.    . naproxen (NAPROSYN) 500 MG tablet Take 500 mg by mouth 2 (two) times daily with a meal.    . ranitidine (ZANTAC) 150 MG tablet Take 150 mg by mouth daily.     . valACYclovir (VALTREX) 500 MG tablet      No current facility-administered medications for this visit.     Review of Systems Review of Systems  Constitutional: Negative.   Respiratory: Negative.   Cardiovascular: Negative.     Blood pressure 130/70, pulse 74, resp. rate 12, height 5\' 8"  (1.727 m), weight 200 lb (90.7 kg).  Physical Exam Physical Exam  Constitutional: She is oriented to person, place, and time. She appears well-developed.  Cardiovascular: Normal rate, regular rhythm and normal heart sounds.   Pulmonary/Chest: Effort normal.        Neurological: She is alert and oriented to person, place, and time.  Skin: Skin is warm and dry.    Data Reviewed Low risk for recurrence based on Mammoprint testing.  Assessment    Severe herpes zoster, rapidly improving on antiviral therapy.  Benign breast exam.    Plan    The patient has been on letrozole for about 5 days and tolerating this well. She'll continue a 2.5 mg daily.  She'll continue on Valtrex pending resolution of her present outbreak.    Return in five months. This information has been scribed by Gaspar Cola CMA.    Robert Bellow 05/04/2016, 10:11 PM

## 2016-05-04 NOTE — Patient Instructions (Addendum)
Return in five months

## 2016-06-02 ENCOUNTER — Ambulatory Visit
Admission: RE | Admit: 2016-06-02 | Discharge: 2016-06-02 | Disposition: A | Discharge status: Home / Self Care | Source: Ambulatory Visit | Attending: Radiation Oncology | Admitting: Radiation Oncology

## 2016-06-02 ENCOUNTER — Encounter: Payer: Self-pay | Admitting: Oncology

## 2016-06-02 ENCOUNTER — Encounter: Payer: Self-pay | Admitting: Radiation Oncology

## 2016-06-02 VITALS — BP 126/77 | HR 99 | Temp 97.8°F | Resp 18 | Ht 66.0 in | Wt 197.8 lb

## 2016-06-02 DIAGNOSIS — C50912 Malignant neoplasm of unspecified site of left female breast: Secondary | ICD-10-CM | POA: Diagnosis not present

## 2016-06-02 DIAGNOSIS — Z79811 Long term (current) use of aromatase inhibitors: Secondary | ICD-10-CM | POA: Diagnosis not present

## 2016-06-02 DIAGNOSIS — Z923 Personal history of irradiation: Secondary | ICD-10-CM | POA: Diagnosis not present

## 2016-06-02 DIAGNOSIS — Z170 Estrogen receptor positive status [ER+]: Secondary | ICD-10-CM | POA: Insufficient documentation

## 2016-06-02 DIAGNOSIS — C50412 Malignant neoplasm of upper-outer quadrant of left female breast: Secondary | ICD-10-CM

## 2016-06-02 NOTE — Progress Notes (Signed)
Radiation Oncology Follow up Note  Name: Karen Phillips   Date:   06/02/2016 MRN:  [id number] DOB: Jul 28, 1950    This 65 y.o. female presents to the clinic today for one-month follow-up status post accelerated partial breast irradiation to her left breast for stage I invasive mammary carcinoma.  REFERRING PROVIDER: Peggyann Juba, NP  HPI: Patient is a 66 year old female now out 1 month having completed accelerated partial breast irradiation to her left breast for stage I (T1 N0 M0) ER/PR positive HER-2/neu negative invasive mammary carcinoma status post wide local excision and sentinel node biopsy. She is seen today in routine follow-up and is doing well. She is without complaint. She specifically denies breast tenderness cough or bone pain.b.She is currently on Femara tolerating that well without side effect.  COMPLICATIONS OF TREATMENT: none  FOLLOW UP COMPLIANCE: keeps appointments   PHYSICAL EXAM:  BP 126/77   Pulse 99   Temp 97.8 F (36.6 C)   Resp 18   Ht _0  (1.676 m)   Wt 197 lb 12 oz (89.7 kg)   BMI 31.92 kg/m  Lungs are clear to A&P cardiac examination essentially unremarkable with regular rate and rhythm. No dominant mass or nodularity is noted in either breast in 2 positions examined. Incision is well-healed. No axillary or supraclavicular adenopathy is appreciated. Cosmetic result is excellent. Still some persistent thickening in the lumpectomy site which we would expect from high dose rate remote afterloading. Well-developed well-nourished patient in NAD. HEENT reveals PERLA, EOMI, discs not visualized.  Oral cavity is clear. No oral mucosal lesions are identified. Neck is clear without evidence of cervical or supraclavicular adenopathy. Lungs are clear to A&P. Cardiac examination is essentially unremarkable with regular rate and rhythm without murmur rub or thrill. Abdomen is benign with no organomegaly or masses noted. Motor sensory and DTR levels are equal and  symmetric in the upper and lower extremities. Cranial nerves II through XII are grossly intact. Proprioception is intact. No peripheral adenopathy or edema is identified. No motor or sensory levels are noted. Crude visual fields are within normal range.  RADIOLOGY RESULTS: No current films for review  PLAN: At the present time patient is doing well 1 month out. I'm please were overall progress. She continues on Femara without side effect. I have asked to see her back in 4-5 months for follow-up. She knows to call sooner with any concerns.  I would like to take this opportunity to thank you for allowing me to participate in the care of your patient.Armstead Peaks., MD

## 2016-06-06 DIAGNOSIS — Z8619 Personal history of other infectious and parasitic diseases: Secondary | ICD-10-CM

## 2016-06-06 HISTORY — DX: Personal history of other infectious and parasitic diseases: Z86.19

## 2016-07-04 NOTE — Progress Notes (Signed)
Karen Phillips  [phone] [phone]  [name] OB: 01-28-1951  MR#: [id number]  ZWC#:[id number]  Patient Care Team: Peggyann Juba, NP as PCP - General Peggyann Juba, NP as Nurse Practitioner Robert Bellow, MD (General Surgery)  CHIEF COMPLAINT: Pathologic stage IA ER/PR positive, HER-2 negative invasive carcinoma of the upper outer quadrant of the left breast.  INTERVAL HISTORY: Patient returns to clinic today for 28-monthfollow-up and evaluation. She currently feels well and is asymptomatic. She has no neurologic complaints. She denies any recent fevers, chills, or illnesses. She has a good appetite and denies weight loss.  She reports chronic bilateral hip pain, worse in the left hip, relieved with naproxen twice daily, monitored by her PCP.  She denies hot flashes. She has no chest pain or shortness of breath. She denies any nausea, vomiting, constipation, or diarrhea. She reports occasional urinary incontinence at baseline, no other urinary complaints. She denies any hematuria or hematochezia.  She reports having trouble getting back to sleep after awakening at night. Patient feels at her baseline and offers no further specific complaints today.  REVIEW OF SYSTEMS:   Review of Systems  Constitutional: Negative.  Negative for fever, malaise/fatigue and weight loss.  Respiratory: Negative.  Negative for cough and shortness of breath.   Cardiovascular: Negative.  Negative for chest pain.  Gastrointestinal: Negative for abdominal pain.  Genitourinary: Negative for hematuria.       Occasional incontinence  Musculoskeletal: Negative.   Neurological: Negative.  Negative for weakness.  Psychiatric/Behavioral: The patient has insomnia. The patient is not nervous/anxious.    As per HPI. Otherwise, a complete review of systems is negative.   PAST MEDICAL HISTORY: Past Medical History:  Diagnosis Date  . Arthritis   . Breast cancer  ([place] 02/15/2016   INVASIVE MAMMARY CARCINOMA  . Cataract   . Hemorrhoids   . Hyperlipidemia   . Hypertension   . Osteoporosis     PAST SURGICAL HISTORY: Past Surgical History:  Procedure Laterality Date  . BREAST BIOPSY Left 02/15/2016   INVASIVE MAMMARY CARCINOMA  . BREAST LUMPECTOMY WITH SENTINEL LYMPH NODE BIOPSY Left 03/22/2016   Procedure: BREAST LUMPECTOMY WITH SENTINEL LYMPH NODE BX;  Surgeon: JRobert Bellow MD;  Location: [facility] ORS;  Service: General;  Laterality: Left;  . COLONOSCOPY  2016    FAMILY HISTORY: Family History  Problem Relation Age of Onset  . Colon cancer Mother   . Breast cancer Maternal Aunt   . Prostate cancer Father     ADVANCED DIRECTIVES (Y/N):  N  HEALTH MAINTENANCE: Social History  Substance Use Topics  . Smoking status: Current Every Day Smoker    Packs/day: 1.00    Years: 40.00    Types: Cigarettes  . Smokeless tobacco: Never Used  . Alcohol use 1.2 oz/week    2 Glasses of wine per week     Colonoscopy:  PAP:  Bone density:  Lipid panel:  Allergies  Allergen Reactions  . Ibuprofen Swelling  . Sulfamethizole Rash    Other reaction(s): UNKNOWN    Current Outpatient Prescriptions  Medication Sig Dispense Refill  . aspirin EC 81 MG tablet Take 81 mg by mouth daily.    .Marland Kitchenatorvastatin (LIPITOR) 20 MG tablet Take 20 mg by mouth daily at 6 PM.     . calcium carbonate (OSCAL) 1500 (600 Ca) MG TABS tablet Take 1,500 mg by mouth daily with breakfast.    . hydroxychloroquine (PLAQUENIL) 200 MG tablet  Take 400 mg by mouth daily.     Marland Kitchen letrozole (FEMARA) 2.5 MG tablet Take 1 tablet (2.5 mg total) by mouth daily. 90 tablet 3  . losartan-hydrochlorothiazide (HYZAAR) 50-12.5 MG tablet Take 1 tablet by mouth daily.     . Multiple Vitamin (MULTIVITAMIN) tablet Take 1 tablet by mouth daily.    . naproxen (NAPROSYN) 500 MG tablet Take 500 mg by mouth 2 (two) times daily with a meal.    . ranitidine (ZANTAC) 150 MG tablet Take 150 mg by  mouth as needed.     . gabapentin (NEURONTIN) 100 MG capsule Take 2 at bedtime and 1 at breakfast and lunch    . HYDROcodone-acetaminophen (NORCO) 5-325 MG tablet Take 1-2 tablets by mouth every 4 (four) hours as needed for moderate pain. (Patient not taking: Reported on 06/02/2016) 30 tablet 0   No current facility-administered medications for this visit.     OBJECTIVE: Vitals:   07/05/16 1129  BP: 120/74  Pulse: 96  Resp: 18  Temp: 98.3 F (36.8 C)     Body mass index is 31.51 kg/m.    ECOG FS:0 - Asymptomatic  General: Well-developed, well-nourished, no acute distress. Eyes: Pink conjunctiva, anicteric sclera. Breasts: Patient had recent breast exam with radiation oncology and requested exam be deferred today. Lungs: Clear to auscultation bilaterally. Heart: Regular rate and rhythm. No rubs, murmurs, or gallops. Abdomen: Soft, nontender, nondistended. No organomegaly noted, normoactive bowel sounds. Musculoskeletal: No edema, cyanosis, or clubbing. Neuro: Alert, answering all questions appropriately. Cranial nerves grossly intact. Skin: No rashes or petechiae noted. Psych: Normal affect.   LAB RESULTS:  Lab Results  Component Value Date   NA 141 03/18/2016   K 4.0 03/18/2016   CL 107 03/18/2016   CO2 24 03/18/2016   GLUCOSE 103 (H) 03/18/2016   BUN 21 (H) 03/18/2016   CREATININE 0.72 03/18/2016   CALCIUM 9.5 03/18/2016   GFRNONAA >60 03/18/2016   GFRAA >60 03/18/2016    Lab Results  Component Value Date   HGB 13.7 03/18/2016     STUDIES: No results found.  ASSESSMENT: Pathologic stage IA ER/PR positive, HER-2 negative invasive carcinoma of the upper outer quadrant of the left breast. Low risk MammoPrint.  PLAN:    1. Pathologic stage IA ER/PR positive, HER-2 negative invasive carcinoma of the upper outer quadrant of the left breast: Patient has now completed her lumpectomy with no change in the staging of her disease. She is also low MammoPrint therefore  did not require adjuvant chemotherapy. Patient competed MammoSite XRT. She is currently taking letrozole, which she will be required to take for 5 years completing in November 2022. Return to clinic in 3 months for routine evaluation. 2. Osteopenia: Patient had a DEXA scan in March 2016 with a T score of -1.8.  Most recent scan on April 14, 2016 showed T score of -2.3.  Patient will start on Boniva every 6 months. 3. Insomnia: Discussed 4-7-8 breathing technique at bedtime: breathe in to count of 4, hold breath for count of 7, exhale for count of 8; do 3-5 times for letting go of overactive thoughts.  Provided breath ratio chart to patient for relaxing, balancing, and energizing breathing techniques.   Patient expressed understanding and was in agreement with this plan. She also understands that She can call clinic at any time with any questions, concerns, or complaints.   Cancer Staging Breast cancer of upper-outer quadrant of left female breast [facility]) Staging form: Breast, AJCC 7th Edition -  Pathologic stage from 04/04/2016: Stage IA (T1b, N0, cM0) - Signed by Lloyd Huger, MD on 04/04/2016   Lucendia Herrlich, NP  07/05/16 8:07 PM   Patient was seen and evaluated independently and I agree with the assessment and plan as dictated above.  Lloyd Huger, MD 07/08/16 10:02 AM

## 2016-07-05 ENCOUNTER — Inpatient Hospital Stay: Attending: Oncology | Admitting: Oncology

## 2016-07-05 VITALS — BP 120/74 | HR 96 | Temp 98.3°F | Resp 18 | Wt 195.2 lb

## 2016-07-05 DIAGNOSIS — F17210 Nicotine dependence, cigarettes, uncomplicated: Secondary | ICD-10-CM | POA: Diagnosis not present

## 2016-07-05 DIAGNOSIS — M8580 Other specified disorders of bone density and structure, unspecified site: Secondary | ICD-10-CM | POA: Insufficient documentation

## 2016-07-05 DIAGNOSIS — Z79811 Long term (current) use of aromatase inhibitors: Secondary | ICD-10-CM | POA: Diagnosis not present

## 2016-07-05 DIAGNOSIS — Z79899 Other long term (current) drug therapy: Secondary | ICD-10-CM | POA: Insufficient documentation

## 2016-07-05 DIAGNOSIS — I10 Essential (primary) hypertension: Secondary | ICD-10-CM | POA: Insufficient documentation

## 2016-07-05 DIAGNOSIS — Z7982 Long term (current) use of aspirin: Secondary | ICD-10-CM | POA: Diagnosis not present

## 2016-07-05 DIAGNOSIS — E785 Hyperlipidemia, unspecified: Secondary | ICD-10-CM | POA: Insufficient documentation

## 2016-07-05 DIAGNOSIS — Z170 Estrogen receptor positive status [ER+]: Secondary | ICD-10-CM

## 2016-07-05 DIAGNOSIS — C50412 Malignant neoplasm of upper-outer quadrant of left female breast: Secondary | ICD-10-CM | POA: Diagnosis present

## 2016-07-05 MED ORDER — IBANDRONATE SODIUM 150 MG PO TABS: 150.0000 mg | ORAL_TABLET | ORAL | 0 refills | Status: DC

## 2016-07-05 NOTE — Progress Notes (Signed)
Patient is here for follow up, she is doing well no complaints.  

## 2016-09-09 ENCOUNTER — Encounter: Payer: Self-pay | Admitting: *Deleted

## 2016-09-09 NOTE — Progress Notes (Signed)
  Oncology Nurse Navigator Documentation  Navigator Location: [facility] (09/09/16 1000)   )Navigator Encounter Type: Letter/Fax/Email (09/09/16 1000)            Thinking of you card mailed to patient.                                        Time Spent with Patient: 15 (09/09/16 1000)

## 2016-09-20 DIAGNOSIS — M06042 Rheumatoid arthritis without rheumatoid factor, left hand: Secondary | ICD-10-CM

## 2016-09-20 DIAGNOSIS — M06041 Rheumatoid arthritis without rheumatoid factor, right hand: Secondary | ICD-10-CM | POA: Insufficient documentation

## 2016-09-28 ENCOUNTER — Ambulatory Visit (INDEPENDENT_AMBULATORY_CARE_PROVIDER_SITE_OTHER): Admitting: General Surgery

## 2016-09-28 ENCOUNTER — Encounter: Payer: Self-pay | Admitting: General Surgery

## 2016-09-28 VITALS — BP 130/72 | HR 74 | Resp 12 | Ht 67.0 in | Wt 197.0 lb

## 2016-09-28 DIAGNOSIS — C50412 Malignant neoplasm of upper-outer quadrant of left female breast: Secondary | ICD-10-CM

## 2016-09-28 NOTE — Patient Instructions (Signed)
  The patient has been asked to return to the office in six month with a bilateral diagnostic mammogram.

## 2016-09-28 NOTE — Progress Notes (Signed)
Patient [name], female   DOB: 06-11-1950, 66 y.o.   MRN: [id number]  Chief Complaint  Patient presents with  . Breast Cancer    HPI [name] is a 66 y.o. female here today for her follow up breast cancer check . Patient states she is doing well. Tolerating Femara well.  HPI  Past Medical History:  Diagnosis Date  . Arthritis   . Breast cancer ([place]) 02/15/2016   INVASIVE MAMMARY CARCINOMA  . Cataract   . Hemorrhoids   . Hyperlipidemia   . Hypertension   . Osteoporosis     Past Surgical History:  Procedure Laterality Date  . BREAST BIOPSY Left 02/15/2016   INVASIVE MAMMARY CARCINOMA  . BREAST LUMPECTOMY WITH SENTINEL LYMPH NODE BIOPSY Left 03/22/2016   Procedure: BREAST LUMPECTOMY WITH SENTINEL LYMPH NODE BX;  Surgeon: Robert Bellow, MD;  Location: [facility] ORS;  Service: General;  Laterality: Left;  . COLONOSCOPY  2016    Family History  Problem Relation Age of Onset  . Colon cancer Mother   . Breast cancer Maternal Aunt   . Prostate cancer Father     Social History Social History  Substance Use Topics  . Smoking status: Current Every Day Smoker    Packs/day: 1.00    Years: 40.00    Types: Cigarettes  . Smokeless tobacco: Never Used  . Alcohol use 1.2 oz/week    2 Glasses of wine per week    Allergies  Allergen Reactions  . Ibuprofen Swelling  . Sulfamethizole Rash    Other reaction(s): UNKNOWN    Current Outpatient Prescriptions  Medication Sig Dispense Refill  . aspirin EC 81 MG tablet Take 81 mg by mouth daily.    Marland Kitchen atorvastatin (LIPITOR) 20 MG tablet Take 20 mg by mouth daily at 6 PM.     . calcium carbonate (OSCAL) 1500 (600 Ca) MG TABS tablet Take 1,500 mg by mouth daily with breakfast.    . gabapentin (NEURONTIN) 100 MG capsule Take 2 at bedtime and 1 at breakfast and lunch    . hydroxychloroquine (PLAQUENIL) 200 MG tablet Take 400 mg by mouth daily.     Marland Kitchen ibandronate (BONIVA) 150 MG tablet Take 1 tablet (150 mg total) by  mouth every 30 (thirty) days. Take in the morning with a full glass of water, on an empty stomach, and do not take anything else by mouth or lie down for the next 30 min. 12 tablet 0  . letrozole (FEMARA) 2.5 MG tablet Take 1 tablet (2.5 mg total) by mouth daily. 90 tablet 3  . losartan-hydrochlorothiazide (HYZAAR) 50-12.5 MG tablet Take 1 tablet by mouth daily.     . Multiple Vitamin (MULTIVITAMIN) tablet Take 1 tablet by mouth daily.    . naproxen (NAPROSYN) 500 MG tablet Take 500 mg by mouth 2 (two) times daily with a meal.    . ranitidine (ZANTAC) 150 MG tablet Take 150 mg by mouth as needed.      No current facility-administered medications for this visit.     Review of Systems Review of Systems  Blood pressure 130/72, pulse 74, resp. rate 12, height 5\' 7"  (1.702 m), weight 197 lb (89.4 kg).  Physical Exam Physical Exam  Constitutional: She is oriented to person, place, and time. She appears well-developed and well-nourished.  Cardiovascular: Normal rate, regular rhythm and normal heart sounds.   Pulmonary/Chest: Effort normal and breath sounds normal.    Lymphadenopathy:    She has no axillary  adenopathy.       Left: No supraclavicular adenopathy present.  Neurological: She is alert and oriented to person, place, and time.  Skin: Skin is warm and dry.    Data Reviewed Bone density examination of 04/10/2016: Osteopenia of the spine and femoral neck.  Assessment    Benign breast exam.    Plan    The patient has been restarted on Boniva under the care of Dr. Grayland Ormond. She is taking adequate calciums and vitamin D supplements. Likely candidate for repeat bone scan late 2018. This will be arranged with Dr. Grayland Ormond.    The patient has been asked to return to the office in six month with a bilateral diagnostic mammogram.  HPI, Physical Exam, Assessment and Plan have been scribed under the direction and in the presence of Hervey Ard, MD.  Gaspar Cola,  CMA    Robert Bellow 09/29/2016, 6:47 PM

## 2016-10-03 NOTE — Progress Notes (Signed)
[place]  Telephone:(336) [phone] Fax:(336) [phone]  [name] OB: 16-Aug-1950  MR#: [id number]  EVO#:[id number]  Patient Care Team: Peggyann Juba, NP as PCP - General Peggyann Juba, NP as Nurse Practitioner Robert Bellow, MD (General Surgery)  CHIEF COMPLAINT: Pathologic stage IA ER/PR positive, HER-2 negative invasive carcinoma of the upper outer quadrant of the left breast.  INTERVAL HISTORY: Patient returns to clinic today for routine 6 month evaluation. She continues to tolerate letrozole well without significant side effects. She currently feels well and is asymptomatic. She has no neurologic complaints. She denies any recent fevers, chills, or illnesses. She has a good appetite and denies weight loss. She has no chest pain or shortness of breath. She denies any nausea, vomiting, constipation, or diarrhea. She has no urinary complaints. Patient feels at her baseline and offers no further specific complaints today.  REVIEW OF SYSTEMS:   Review of Systems  Constitutional: Negative.  Negative for fever, malaise/fatigue and weight loss.  Respiratory: Negative.  Negative for cough and shortness of breath.   Cardiovascular: Negative.  Negative for chest pain and leg swelling.  Gastrointestinal: Negative for abdominal pain.  Genitourinary: Negative for hematuria.  Musculoskeletal: Negative.   Skin: Negative.  Negative for rash.  Neurological: Negative.  Negative for sensory change and weakness.  Psychiatric/Behavioral: Negative.  The patient is not nervous/anxious and does not have insomnia.    As per HPI. Otherwise, a complete review of systems is negative.   PAST MEDICAL HISTORY: Past Medical History:  Diagnosis Date  . Arthritis   . Breast cancer ([place]) 02/15/2016   INVASIVE MAMMARY CARCINOMA  . Cataract   . Hemorrhoids   . Hyperlipidemia   . Hypertension   . Osteoporosis     PAST SURGICAL HISTORY: Past Surgical History:  Procedure  Laterality Date  . BREAST BIOPSY Left 02/15/2016   INVASIVE MAMMARY CARCINOMA  . BREAST LUMPECTOMY WITH SENTINEL LYMPH NODE BIOPSY Left 03/22/2016   Procedure: BREAST LUMPECTOMY WITH SENTINEL LYMPH NODE BX;  Surgeon: Robert Bellow, MD;  Location: [facility] ORS;  Service: General;  Laterality: Left;  . COLONOSCOPY  2016    FAMILY HISTORY: Family History  Problem Relation Age of Onset  . Colon cancer Mother   . Breast cancer Maternal Aunt   . Prostate cancer Father     ADVANCED DIRECTIVES (Y/N):  N  HEALTH MAINTENANCE: Social History  Substance Use Topics  . Smoking status: Current Every Day Smoker    Packs/day: 1.00    Years: 40.00    Types: Cigarettes  . Smokeless tobacco: Never Used  . Alcohol use 1.2 oz/week    2 Glasses of wine per week     Colonoscopy:  PAP:  Bone density:  Lipid panel:  Allergies  Allergen Reactions  . Ibuprofen Swelling  . Sulfamethizole Rash    Other reaction(s): UNKNOWN    Current Outpatient Prescriptions  Medication Sig Dispense Refill  . aspirin EC 81 MG tablet Take 81 mg by mouth daily.    Marland Kitchen atorvastatin (LIPITOR) 20 MG tablet Take 20 mg by mouth daily at 6 PM.     . calcium carbonate (OSCAL) 1500 (600 Ca) MG TABS tablet Take 1,500 mg by mouth daily with breakfast.    . gabapentin (NEURONTIN) 100 MG capsule Take 2 at bedtime and 1 at breakfast and lunch    . hydroxychloroquine (PLAQUENIL) 200 MG tablet Take 400 mg by mouth daily.     Marland Kitchen ibandronate (BONIVA) 150  MG tablet Take 1 tablet (150 mg total) by mouth every 30 (thirty) days. Take in the morning with a full glass of water, on an empty stomach, and do not take anything else by mouth or lie down for the next 30 min. 12 tablet 0  . letrozole (FEMARA) 2.5 MG tablet Take 1 tablet (2.5 mg total) by mouth daily. 90 tablet 3  . losartan-hydrochlorothiazide (HYZAAR) 50-12.5 MG tablet Take 1 tablet by mouth daily.     . Multiple Vitamin (MULTIVITAMIN) tablet Take 1 tablet by mouth daily.    .  naproxen (NAPROSYN) 500 MG tablet Take 500 mg by mouth 2 (two) times daily with a meal.    . ranitidine (ZANTAC) 150 MG tablet Take 150 mg by mouth as needed.      No current facility-administered medications for this visit.     OBJECTIVE: Vitals:   10/04/16 1218  BP: 127/76  Pulse: 87  Temp: 97.8 F (36.6 C)     Body mass index is 31.15 kg/m.    ECOG FS:0 - Asymptomatic  General: Well-developed, well-nourished, no acute distress. Eyes: Pink conjunctiva, anicteric sclera. Breasts: Patient requested exam be deferred today. Lungs: Clear to auscultation bilaterally. Heart: Regular rate and rhythm. No rubs, murmurs, or gallops. Abdomen: Soft, nontender, nondistended. No organomegaly noted, normoactive bowel sounds. Musculoskeletal: No edema, cyanosis, or clubbing. Neuro: Alert, answering all questions appropriately. Cranial nerves grossly intact. Skin: No rashes or petechiae noted. Psych: Normal affect.   LAB RESULTS:  Lab Results  Component Value Date   NA 141 03/18/2016   K 4.0 03/18/2016   CL 107 03/18/2016   CO2 24 03/18/2016   GLUCOSE 103 (H) 03/18/2016   BUN 21 (H) 03/18/2016   CREATININE 0.72 03/18/2016   CALCIUM 9.5 03/18/2016   GFRNONAA >60 03/18/2016   GFRAA >60 03/18/2016    Lab Results  Component Value Date   HGB 13.7 03/18/2016     STUDIES: No results found.  ASSESSMENT: Pathologic stage IA ER/PR positive, HER-2 negative invasive carcinoma of the upper outer quadrant of the left breast. Low risk MammoPrint.  PLAN:    1. Pathologic stage IA ER/PR positive, HER-2 negative invasive carcinoma of the upper outer quadrant of the left breast: Patient completed her lumpectomy and since she is also low MammoPrint, she did not require adjuvant chemotherapy. Patient competed MammoSite XRT. She is currently taking letrozole, which she will be required to take for 5 years completing in November 2022. Return to clinic in 6 months for routine evaluation. 2.  Osteopenia: Patient's most recent bone mineral density on April 13, 2016 reported T score of -2.3. Continue calcium and vitamin D supplements. Patient also takes Boniva every 6 months. Repeat in November 2018  Patient expressed understanding and was in agreement with this plan. She also understands that She can call clinic at any time with any questions, concerns, or complaints.   Cancer Staging Malignant neoplasm of female breast [facility]) Staging form: Breast, AJCC 7th Edition - Pathologic stage from 04/04/2016: Stage IA (T1b, N0, cM0) - Signed by Lloyd Huger, MD on 04/04/2016   Lloyd Huger, MD 10/05/16 2:59 PM

## 2016-10-04 ENCOUNTER — Encounter: Payer: Self-pay | Admitting: Oncology

## 2016-10-04 ENCOUNTER — Inpatient Hospital Stay: Attending: Oncology | Admitting: Oncology

## 2016-10-04 VITALS — BP 127/76 | HR 87 | Temp 97.8°F | Wt 198.9 lb

## 2016-10-04 DIAGNOSIS — I10 Essential (primary) hypertension: Secondary | ICD-10-CM | POA: Diagnosis not present

## 2016-10-04 DIAGNOSIS — M8580 Other specified disorders of bone density and structure, unspecified site: Secondary | ICD-10-CM | POA: Insufficient documentation

## 2016-10-04 DIAGNOSIS — F17210 Nicotine dependence, cigarettes, uncomplicated: Secondary | ICD-10-CM | POA: Insufficient documentation

## 2016-10-04 DIAGNOSIS — Z923 Personal history of irradiation: Secondary | ICD-10-CM | POA: Insufficient documentation

## 2016-10-04 DIAGNOSIS — E785 Hyperlipidemia, unspecified: Secondary | ICD-10-CM | POA: Diagnosis not present

## 2016-10-04 DIAGNOSIS — C50412 Malignant neoplasm of upper-outer quadrant of left female breast: Secondary | ICD-10-CM | POA: Insufficient documentation

## 2016-10-04 DIAGNOSIS — Z170 Estrogen receptor positive status [ER+]: Secondary | ICD-10-CM | POA: Insufficient documentation

## 2016-10-04 DIAGNOSIS — Z79811 Long term (current) use of aromatase inhibitors: Secondary | ICD-10-CM | POA: Diagnosis not present

## 2016-10-04 DIAGNOSIS — Z7982 Long term (current) use of aspirin: Secondary | ICD-10-CM | POA: Insufficient documentation

## 2016-11-03 ENCOUNTER — Ambulatory Visit: Admitting: Radiation Oncology

## 2016-11-08 ENCOUNTER — Ambulatory Visit: Admitting: Radiation Oncology

## 2016-11-16 ENCOUNTER — Ambulatory Visit
Admission: RE | Admit: 2016-11-16 | Discharge: 2016-11-16 | Disposition: A | Discharge status: Home / Self Care | Source: Ambulatory Visit | Attending: Radiation Oncology | Admitting: Radiation Oncology

## 2016-11-16 ENCOUNTER — Encounter: Payer: Self-pay | Admitting: Radiation Oncology

## 2016-11-16 VITALS — BP 140/88 | HR 93 | Temp 96.6°F | Wt 197.9 lb

## 2016-11-16 DIAGNOSIS — Z923 Personal history of irradiation: Secondary | ICD-10-CM | POA: Diagnosis not present

## 2016-11-16 DIAGNOSIS — Z79811 Long term (current) use of aromatase inhibitors: Secondary | ICD-10-CM | POA: Diagnosis not present

## 2016-11-16 DIAGNOSIS — Z170 Estrogen receptor positive status [ER+]: Secondary | ICD-10-CM | POA: Insufficient documentation

## 2016-11-16 DIAGNOSIS — C50412 Malignant neoplasm of upper-outer quadrant of left female breast: Secondary | ICD-10-CM | POA: Insufficient documentation

## 2016-11-16 NOTE — Progress Notes (Signed)
Radiation Oncology Follow up Note  Name: Karen Phillips   Date:   11/16/2016 MRN:  [id number] DOB: 04-Sep-1950    This 66 y.o. female presents to the clinic today for six-month follow-up status post accelerated partial breast irradiation to her left breast for stage I invasive mammary carcinoma.  REFERRING PROVIDER: Peggyann Juba, NP  HPI: Patient is a 66 year old female now out 6 months having completed accelerated partial breast radiation to her left breast for stage I invasive mammary carcinoma ER/PR positive. She is seen today in routine follow-up and is doing well. She's been followed by her surgeon and mammograms have been ordered for October. She is currently on anti-estrogen therapy and tolerating that well.. She is on Femara. She specifically denies breast tenderness cough or bone pain.  COMPLICATIONS OF TREATMENT: none  FOLLOW UP COMPLIANCE: keeps appointments   PHYSICAL EXAM:  BP 140/88   Pulse 93   Temp (!) 96.6 F (35.9 C)   Wt 197 lb 13.8 oz (89.8 kg)   BMI 30.99 kg/m  Lungs are clear to A&P cardiac examination essentially unremarkable with regular rate and rhythm. No dominant mass or nodularity is noted in either breast in 2 positions examined. Incision is well-healed. No axillary or supraclavicular adenopathy is appreciated. Cosmetic result is excellent. Well-developed well-nourished patient in NAD. HEENT reveals PERLA, EOMI, discs not visualized.  Oral cavity is clear. No oral mucosal lesions are identified. Neck is clear without evidence of cervical or supraclavicular adenopathy. Lungs are clear to A&P. Cardiac examination is essentially unremarkable with regular rate and rhythm without murmur rub or thrill. Abdomen is benign with no organomegaly or masses noted. Motor sensory and DTR levels are equal and symmetric in the upper and lower extremities. Cranial nerves II through XII are grossly intact. Proprioception is intact. No peripheral adenopathy or edema is  identified. No motor or sensory levels are noted. Crude visual fields are within normal range.  RADIOLOGY RESULTS: No current films for review  PLAN: The present time she is doing well with no evidence of disease. I am please were overall progress. Surgeon will be ordering her follow-up mammograms. She continues on Femara without side effect. I've asked to see her back in 6 months for follow-up and then will start once your follow-up visits. Patient knows to call with any concerns.  I would like to take this opportunity to thank you for allowing me to participate in the care of your patient.Armstead Peaks., MD

## 2017-02-02 ENCOUNTER — Other Ambulatory Visit: Payer: Self-pay

## 2017-02-02 DIAGNOSIS — C50412 Malignant neoplasm of upper-outer quadrant of left female breast: Secondary | ICD-10-CM

## 2017-02-02 DIAGNOSIS — Z170 Estrogen receptor positive status [ER+]: Principal | ICD-10-CM

## 2017-03-20 ENCOUNTER — Other Ambulatory Visit: Payer: Self-pay | Admitting: Oncology

## 2017-03-24 ENCOUNTER — Ambulatory Visit
Admission: RE | Admit: 2017-03-24 | Discharge: 2017-03-24 | Disposition: A | Discharge status: Home / Self Care | Source: Ambulatory Visit | Attending: General Surgery | Admitting: General Surgery

## 2017-03-24 DIAGNOSIS — Z853 Personal history of malignant neoplasm of breast: Secondary | ICD-10-CM | POA: Insufficient documentation

## 2017-03-24 DIAGNOSIS — C50412 Malignant neoplasm of upper-outer quadrant of left female breast: Secondary | ICD-10-CM

## 2017-03-24 DIAGNOSIS — Z170 Estrogen receptor positive status [ER+]: Principal | ICD-10-CM

## 2017-03-30 ENCOUNTER — Encounter: Payer: Self-pay | Admitting: General Surgery

## 2017-03-30 ENCOUNTER — Ambulatory Visit (INDEPENDENT_AMBULATORY_CARE_PROVIDER_SITE_OTHER): Admitting: General Surgery

## 2017-03-30 VITALS — BP 132/74 | HR 72 | Resp 14 | Ht 67.0 in | Wt 193.0 lb

## 2017-03-30 DIAGNOSIS — C50412 Malignant neoplasm of upper-outer quadrant of left female breast: Secondary | ICD-10-CM | POA: Diagnosis not present

## 2017-03-30 NOTE — Progress Notes (Signed)
Patient [name], female   DOB: 01-02-1951, 66 y.o.   MRN: [id number]  Chief Complaint  Patient presents with  . Follow-up    HPI Karen Phillips is a 66 y.o. female who presents for a breast evaluation. The most recent mammogram was done on 03/24/2017.  Patient does perform regular self breast checks and gets regular mammograms done.  Patient had the shingles about four months ago.   HPI  Past Medical History:  Diagnosis Date  . Arthritis   . Breast cancer ([place]) 02/15/2016   INVASIVE MAMMARY CARCINOMA  . Cataract   . Hemorrhoids   . Hyperlipidemia   . Hypertension   . Osteoporosis     Past Surgical History:  Procedure Laterality Date  . BREAST BIOPSY Left 02/15/2016   INVASIVE MAMMARY CARCINOMA  . BREAST LUMPECTOMY Left 2017  . BREAST LUMPECTOMY WITH SENTINEL LYMPH NODE BIOPSY Left 03/22/2016   Procedure: BREAST LUMPECTOMY WITH SENTINEL LYMPH NODE BX;  Surgeon: Robert Bellow, MD;  Location: [facility] ORS;  Service: General;  Laterality: Left;  . COLONOSCOPY  2016    Family History  Problem Relation Age of Onset  . Colon cancer Mother   . Breast cancer Maternal Aunt   . Prostate cancer Father     Social History Social History  Substance Use Topics  . Smoking status: Current Every Day Smoker    Packs/day: 1.00    Years: 40.00    Types: Cigarettes  . Smokeless tobacco: Never Used  . Alcohol use 1.2 oz/week    2 Glasses of wine per week    Allergies  Allergen Reactions  . Ibuprofen Swelling  . Sulfamethizole Rash    Other reaction(s): UNKNOWN    Current Outpatient Prescriptions  Medication Sig Dispense Refill  . aspirin EC 81 MG tablet Take 81 mg by mouth daily.    Marland Kitchen atorvastatin (LIPITOR) 20 MG tablet Take 20 mg by mouth daily at 6 PM.     . calcium carbonate (OSCAL) 1500 (600 Ca) MG TABS tablet Take 1,500 mg by mouth daily with breakfast.    . hydroxychloroquine (PLAQUENIL) 200 MG tablet Take 400 mg by mouth daily.     Marland Kitchen ibandronate  (BONIVA) 150 MG tablet Take 1 tablet (150 mg total) by mouth every 30 (thirty) days. Take in the morning with a full glass of water, on an empty stomach, and do not take anything else by mouth or lie down for the next 30 min. 12 tablet 0  . letrozole (FEMARA) 2.5 MG tablet TAKE 1 TABLET DAILY 90 tablet 3  . losartan-hydrochlorothiazide (HYZAAR) 50-12.5 MG tablet Take 1 tablet by mouth daily.     . Multiple Vitamin (MULTIVITAMIN) tablet Take 1 tablet by mouth daily.    . naproxen (NAPROSYN) 500 MG tablet Take 500 mg by mouth 2 (two) times daily with a meal.    . ranitidine (ZANTAC) 150 MG tablet Take 150 mg by mouth as needed.      No current facility-administered medications for this visit.     Review of Systems Review of Systems  Constitutional: Negative.   Respiratory: Negative.   Cardiovascular: Negative.     Blood pressure 132/74, pulse 72, resp. rate 14, height 5\' 7"  (1.702 m), weight 193 lb (87.5 kg), SpO2 98 %.  Physical Exam Physical Exam  Constitutional: She is oriented to person, place, and time. She appears well-developed and well-nourished.  Eyes: Conjunctivae are normal. No scleral icterus.  Neck: Neck supple.  Cardiovascular:  Normal rate, regular rhythm and normal heart sounds.   Pulmonary/Chest: Effort normal and breath sounds normal. Right breast exhibits no inverted nipple, no mass, no nipple discharge, no skin change and no tenderness. Left breast exhibits no inverted nipple, no mass, no nipple discharge, no skin change and no tenderness.    Lymphadenopathy:    She has no cervical adenopathy.    She has no axillary adenopathy.       Left: No supraclavicular adenopathy present.  Neurological: She is alert and oriented to person, place, and time.  Skin: Skin is warm and dry.    Data Reviewed 03/24/2017 bilateral diagnostic mammograms reviewed. Minimal postsurgical changes. BIRAD-2.  Bone density testing dated 04/14/2016 showed osteopenia in both the femur and  spine. The patient is presently making use of calcium and vitamin D supplements. No other adjuncts.  Assessment    Doing well now one year out from treatment of her breast cancer.  Good tolerance of letrozole.    Plan    The patient will be following up in the near future with Dr. Grayland Ormond.    The patient has been asked to return to the office in one year with a bilateral diagnostic mammogram.The patient is aware to call back for any questions or concerns.  HPI, Physical Exam, Assessment and Plan have been scribed under the direction and in the presence of Hervey Ard, MD.  Gaspar Cola, CMA  I have completed the exam and reviewed the above documentation for accuracy and completeness.  I agree with the above.  Haematologist has been used and any errors in dictation or transcription are unintentional.  Hervey Ard, M.D., F.A.C.S.   Robert Bellow 03/30/2017, 8:34 PM

## 2017-03-30 NOTE — Patient Instructions (Signed)
The patient has been asked to return to the office in one year with a bilateral diagnostic mammogram.The patient is aware to call back for any questions or concerns. 

## 2017-04-03 ENCOUNTER — Telehealth: Payer: Self-pay | Admitting: Oncology

## 2017-04-03 NOTE — Telephone Encounter (Signed)
Received pre-auth letter from [facility], scanned into Epic under media tab

## 2017-04-03 NOTE — Progress Notes (Signed)
[place]  Telephone:(336) [phone] Fax:(336) [phone]  [name] OB: 1951-01-25  MR#: [id number]  TDS#:[id number]  Patient Care Team: Peggyann Juba, NP as PCP - General Peggyann Juba, NP as Nurse Practitioner Bary Castilla Forest Gleason, MD (General Surgery)  CHIEF COMPLAINT: Pathologic stage IA ER/PR positive, HER-2 negative invasive carcinoma of the upper outer quadrant of the left breast.  INTERVAL HISTORY: Patient returns to clinic today for routine 6 month evaluation. She reports increased hair loss, but otherwise is tolerating letrozole well. She currently feels well and is asymptomatic. She has no neurologic complaints. She denies any recent fevers, chills, or illnesses. She has a good appetite and denies weight loss. She has no chest pain or shortness of breath. She denies any nausea, vomiting, constipation, or diarrhea. She has no urinary complaints. Patient offers no further specific complaints today.  REVIEW OF SYSTEMS:   Review of Systems  Constitutional: Negative.  Negative for fever, malaise/fatigue and weight loss.  Respiratory: Negative.  Negative for cough and shortness of breath.   Cardiovascular: Negative.  Negative for chest pain and leg swelling.  Gastrointestinal: Negative for abdominal pain.  Genitourinary: Negative for hematuria.  Musculoskeletal: Negative.   Skin: Negative.  Negative for rash.  Neurological: Negative.  Negative for sensory change and weakness.  Psychiatric/Behavioral: Negative.  The patient is not nervous/anxious and does not have insomnia.    As per HPI. Otherwise, a complete review of systems is negative.   PAST MEDICAL HISTORY: Past Medical History:  Diagnosis Date  . Arthritis   . Breast cancer ([place]) 02/15/2016   INVASIVE MAMMARY CARCINOMA  . Cataract   . Hemorrhoids   . Hyperlipidemia   . Hypertension   . Osteoporosis     PAST SURGICAL HISTORY: Past Surgical History:  Procedure Laterality Date  .  BREAST BIOPSY Left 02/15/2016   INVASIVE MAMMARY CARCINOMA  . BREAST LUMPECTOMY Left 2017  . BREAST LUMPECTOMY WITH SENTINEL LYMPH NODE BIOPSY Left 03/22/2016   Procedure: BREAST LUMPECTOMY WITH SENTINEL LYMPH NODE BX;  Surgeon: Robert Bellow, MD;  Location: [facility] ORS;  Service: General;  Laterality: Left;  . COLONOSCOPY  2016    FAMILY HISTORY: Family History  Problem Relation Age of Onset  . Colon cancer Mother   . Breast cancer Maternal Aunt   . Prostate cancer Father     ADVANCED DIRECTIVES (Y/N):  N  HEALTH MAINTENANCE: Social History  Substance Use Topics  . Smoking status: Current Every Day Smoker    Packs/day: 1.00    Years: 40.00    Types: Cigarettes  . Smokeless tobacco: Never Used  . Alcohol use 1.2 oz/week    2 Glasses of wine per week     Colonoscopy:  PAP:  Bone density:  Lipid panel:  Allergies  Allergen Reactions  . Ibuprofen Swelling  . Sulfamethizole Rash    Other reaction(s): UNKNOWN    Current Outpatient Prescriptions  Medication Sig Dispense Refill  . aspirin EC 81 MG tablet Take 81 mg by mouth daily.    Marland Kitchen atorvastatin (LIPITOR) 20 MG tablet Take 20 mg by mouth daily at 6 PM.     . calcium carbonate (OSCAL) 1500 (600 Ca) MG TABS tablet Take 1,500 mg by mouth daily with breakfast.    . hydroxychloroquine (PLAQUENIL) 200 MG tablet Take 400 mg by mouth daily.     Marland Kitchen ibandronate (BONIVA) 150 MG tablet Take 1 tablet (150 mg total) by mouth every 30 (thirty) days. Take in the  morning with a full glass of water, on an empty stomach, and do not take anything else by mouth or lie down for the next 30 min. 12 tablet 0  . letrozole (FEMARA) 2.5 MG tablet TAKE 1 TABLET DAILY 90 tablet 3  . losartan-hydrochlorothiazide (HYZAAR) 50-12.5 MG tablet Take 1 tablet by mouth daily.     . Multiple Vitamin (MULTIVITAMIN) tablet Take 1 tablet by mouth daily.    . naproxen (NAPROSYN) 500 MG tablet Take 500 mg by mouth 2 (two) times daily with a meal.    .  ranitidine (ZANTAC) 150 MG tablet Take 150 mg by mouth as needed.     Marland Kitchen anastrozole (ARIMIDEX) 1 MG tablet Take 1 tablet (1 mg total) by mouth daily. 90 tablet 2   No current facility-administered medications for this visit.     OBJECTIVE: Vitals:   04/06/17 1134  BP: 112/78  Pulse: 93  Resp: 20  Temp: 97.8 F (36.6 C)     Body mass index is 30.4 kg/m.    ECOG FS:0 - Asymptomatic  General: Well-developed, well-nourished, no acute distress. Eyes: Pink conjunctiva, anicteric sclera. Breasts: Patient reports a normal exam recently by another provider. Lungs: Clear to auscultation bilaterally. Heart: Regular rate and rhythm. No rubs, murmurs, or gallops. Abdomen: Soft, nontender, nondistended. No organomegaly noted, normoactive bowel sounds. Musculoskeletal: No edema, cyanosis, or clubbing. Neuro: Alert, answering all questions appropriately. Cranial nerves grossly intact. Skin: No rashes or petechiae noted. Psych: Normal affect.   LAB RESULTS:  Lab Results  Component Value Date   NA 141 03/18/2016   K 4.0 03/18/2016   CL 107 03/18/2016   CO2 24 03/18/2016   GLUCOSE 103 (H) 03/18/2016   BUN 21 (H) 03/18/2016   CREATININE 0.72 03/18/2016   CALCIUM 9.5 03/18/2016   GFRNONAA >60 03/18/2016   GFRAA >60 03/18/2016    Lab Results  Component Value Date   HGB 13.7 03/18/2016     STUDIES: Mm Diag Breast Tomo Bilateral  Result Date: 03/24/2017 CLINICAL DATA:  66 year old patient history of left breast cancer diagnosed in 2017. She had a lumpectomy in October of 2017. She is asymptomatic. EXAM: 2D DIGITAL DIAGNOSTIC BILATERAL MAMMOGRAM WITH CAD AND ADJUNCT TOMO COMPARISON:  Previous exam(s). ACR Breast Density Category a: The breast tissue is almost entirely fatty. FINDINGS: There are lumpectomy changes in the upper-outer quadrant of the left breast. No mass, nonsurgical distortion, or suspicious microcalcification is identified in either breast to suggest malignancy.  Mammographic images were processed with CAD. IMPRESSION: No evidence of malignancy in either breast. Lumpectomy changes on the left. RECOMMENDATION: Diagnostic mammogram is suggested in 1 year. (Code:DM-B-01Y) I have discussed the findings and recommendations with the patient. Results were also provided in writing at the conclusion of the visit. If applicable, a reminder letter will be sent to the patient regarding the next appointment. BI-RADS CATEGORY  2: Benign. Electronically Signed   By: Curlene Dolphin M.D.   On: 03/24/2017 10:40    ASSESSMENT: Pathologic stage IA ER/PR positive, HER-2 negative invasive carcinoma of the upper outer quadrant of the left breast. Low risk MammoPrint.  PLAN:    1. Pathologic stage IA ER/PR positive, HER-2 negative invasive carcinoma of the upper outer quadrant of the left breast: Patient had her lumpectomy March 22, 2016.  She also was found to be a low risk MammoPrint, therefore did not require adjuvant chemotherapy. Patient competed MammoSite XRT. She is currently taking letrozole but will switch to anastrozole because of hair  loss.  Continue treatment for 5 years completing in November 2022.  Her most recent mammogram on March 24, 2017 was reported as BI-RADS 2.  Repeat in October 2019.  Return to clinic in 6 months for routine evaluation. 2. Osteopenia: Patient's most recent bone mineral density on April 13, 2016 reported T score of -2.3. Continue calcium and vitamin D supplements. Patient also takes Boniva every 6 months. Repeat in the next 1-2 weeks.  Approximately 30 minutes was spent in discussion of which greater than 50% was consultation.  Patient expressed understanding and was in agreement with this plan. She also understands that She can call clinic at any time with any questions, concerns, or complaints.   Cancer Staging Malignant neoplasm of female breast [facility]) Staging form: Breast, AJCC 7th Edition - Pathologic stage from 04/04/2016: Stage IA  (T1b, N0, cM0) - Signed by Lloyd Huger, MD on 04/04/2016   Lloyd Huger, MD 04/07/17 8:28 AM

## 2017-04-06 ENCOUNTER — Inpatient Hospital Stay: Attending: Oncology | Admitting: Oncology

## 2017-04-06 VITALS — BP 112/78 | HR 93 | Temp 97.8°F | Resp 20 | Wt 194.1 lb

## 2017-04-06 DIAGNOSIS — C50412 Malignant neoplasm of upper-outer quadrant of left female breast: Secondary | ICD-10-CM | POA: Diagnosis present

## 2017-04-06 DIAGNOSIS — K649 Unspecified hemorrhoids: Secondary | ICD-10-CM | POA: Insufficient documentation

## 2017-04-06 DIAGNOSIS — M810 Age-related osteoporosis without current pathological fracture: Secondary | ICD-10-CM | POA: Diagnosis not present

## 2017-04-06 DIAGNOSIS — Z79811 Long term (current) use of aromatase inhibitors: Secondary | ICD-10-CM | POA: Diagnosis not present

## 2017-04-06 DIAGNOSIS — I10 Essential (primary) hypertension: Secondary | ICD-10-CM | POA: Diagnosis not present

## 2017-04-06 DIAGNOSIS — Z170 Estrogen receptor positive status [ER+]: Secondary | ICD-10-CM | POA: Diagnosis not present

## 2017-04-06 DIAGNOSIS — Z923 Personal history of irradiation: Secondary | ICD-10-CM | POA: Insufficient documentation

## 2017-04-06 DIAGNOSIS — M1990 Unspecified osteoarthritis, unspecified site: Secondary | ICD-10-CM | POA: Insufficient documentation

## 2017-04-06 DIAGNOSIS — E785 Hyperlipidemia, unspecified: Secondary | ICD-10-CM | POA: Insufficient documentation

## 2017-04-06 DIAGNOSIS — H269 Unspecified cataract: Secondary | ICD-10-CM | POA: Diagnosis not present

## 2017-04-06 DIAGNOSIS — F17210 Nicotine dependence, cigarettes, uncomplicated: Secondary | ICD-10-CM | POA: Insufficient documentation

## 2017-04-06 MED ORDER — ANASTROZOLE 1 MG PO TABS: 1.0000 mg | ORAL_TABLET | Freq: Every day | ORAL | 2 refills | Status: DC

## 2017-04-06 NOTE — Progress Notes (Signed)
Patient reports hair loss with letrozole, denies other concerns today.

## 2017-05-02 ENCOUNTER — Ambulatory Visit
Admission: RE | Admit: 2017-05-02 | Discharge: 2017-05-02 | Disposition: A | Discharge status: Home / Self Care | Source: Ambulatory Visit | Attending: Oncology | Admitting: Oncology

## 2017-05-02 DIAGNOSIS — M85852 Other specified disorders of bone density and structure, left thigh: Secondary | ICD-10-CM | POA: Diagnosis not present

## 2017-05-02 DIAGNOSIS — C50412 Malignant neoplasm of upper-outer quadrant of left female breast: Secondary | ICD-10-CM

## 2017-05-02 DIAGNOSIS — Z780 Asymptomatic menopausal state: Secondary | ICD-10-CM | POA: Insufficient documentation

## 2017-05-02 DIAGNOSIS — M8588 Other specified disorders of bone density and structure, other site: Secondary | ICD-10-CM | POA: Insufficient documentation

## 2017-05-02 DIAGNOSIS — Z170 Estrogen receptor positive status [ER+]: Secondary | ICD-10-CM | POA: Insufficient documentation

## 2017-05-19 ENCOUNTER — Ambulatory Visit
Admission: RE | Admit: 2017-05-19 | Discharge: 2017-05-19 | Disposition: A | Discharge status: Home / Self Care | Source: Ambulatory Visit | Attending: Radiation Oncology | Admitting: Radiation Oncology

## 2017-05-19 ENCOUNTER — Encounter: Payer: Self-pay | Admitting: Radiation Oncology

## 2017-05-19 ENCOUNTER — Other Ambulatory Visit: Payer: Self-pay

## 2017-05-19 VITALS — BP 129/78 | HR 88 | Temp 96.6°F | Resp 20 | Wt 194.0 lb

## 2017-05-19 DIAGNOSIS — Z923 Personal history of irradiation: Secondary | ICD-10-CM | POA: Diagnosis not present

## 2017-05-19 DIAGNOSIS — Z170 Estrogen receptor positive status [ER+]: Secondary | ICD-10-CM | POA: Diagnosis not present

## 2017-05-19 DIAGNOSIS — C50412 Malignant neoplasm of upper-outer quadrant of left female breast: Secondary | ICD-10-CM

## 2017-05-19 DIAGNOSIS — Z79811 Long term (current) use of aromatase inhibitors: Secondary | ICD-10-CM | POA: Insufficient documentation

## 2017-05-19 NOTE — Progress Notes (Signed)
Radiation Oncology Follow up Note  Name: Karen Phillips   Date:   05/19/2017 MRN:  [id number] DOB: 1951/05/15    This 66 y.o. female presents to the clinic today for 1 year follow-up status post accelerated partial breast radiation to her left breast for stage I invasive mammary carcinoma  REFERRING PROVIDER: Peggyann Juba, NP  HPI: Patient is a 66 year old female now out 1 year having completed accelerated partial breast radiation to her left breast for stage I invasive mammary carcinoma ER/PR positive. She is seen today in routine follow-up is doing well. She specifically denies breast tenderness cough or bone pain.Marland Kitchen Her last mammogram which I have reviewed was back in October was BI-RADS 2 benign. She is currently on Femara tolerate that well without side effect.  COMPLICATIONS OF TREATMENT: none  FOLLOW UP COMPLIANCE: keeps appointments   PHYSICAL EXAM:  BP 129/78   Pulse 88   Temp (!) 96.6 F (35.9 C)   Resp 20   Wt 194 lb 0.1 oz (88 kg)   BMI 30.39 kg/m  Lungs are clear to A&P cardiac examination essentially unremarkable with regular rate and rhythm. No dominant mass or nodularity is noted in either breast in 2 positions examined. Incision is well-healed. No axillary or supraclavicular adenopathy is appreciated. Cosmetic result is excellent. Well-developed well-nourished patient in NAD. HEENT reveals PERLA, EOMI, discs not visualized.  Oral cavity is clear. No oral mucosal lesions are identified. Neck is clear without evidence of cervical or supraclavicular adenopathy. Lungs are clear to A&P. Cardiac examination is essentially unremarkable with regular rate and rhythm without murmur rub or thrill. Abdomen is benign with no organomegaly or masses noted. Motor sensory and DTR levels are equal and symmetric in the upper and lower extremities. Cranial nerves II through XII are grossly intact. Proprioception is intact. No peripheral adenopathy or edema is identified. No motor or  sensory levels are noted. Crude visual fields are within normal range.  RADIOLOGY RESULTS: Mammograms are reviewed and compatible with the above-stated findings  PLAN: Present time she continues to do well with no evidence of disease. I've asked to see her back in 1 year for follow-up. Follow-up mammograms ordered and scheduled. She continues on Femara without side effect. Patient knows to call with any concerns.  I would like to take this opportunity to thank you for allowing me to participate in the care of your patient.Noreene Filbert, MD

## 2017-05-22 ENCOUNTER — Other Ambulatory Visit: Payer: Self-pay | Admitting: Oncology

## 2017-05-22 DIAGNOSIS — Z170 Estrogen receptor positive status [ER+]: Principal | ICD-10-CM

## 2017-05-22 DIAGNOSIS — M8580 Other specified disorders of bone density and structure, unspecified site: Secondary | ICD-10-CM

## 2017-05-22 DIAGNOSIS — C50412 Malignant neoplasm of upper-outer quadrant of left female breast: Secondary | ICD-10-CM

## 2017-06-01 IMAGING — MG US BREAST BX W LOC DEV 1ST LESION IMG BX SPEC US GUIDE*L*
1 series · 8 of 8 positions shown · non-contrast
Comparison: Previous exam(s).

ADDENDUM:
The final pathological diagnosis is invasive mammary carcinoma. This
is concordant with the imaging findings. Surgical consultation is
recommended.

The final pathological diagnosis and recommendation were discussed
with the patient by telephone on 02/16/2016. Her questions were
answered. She reported no pain, bruising or palpable hematoma at the
biopsy site.
The patient would prefer to have her surgery and treatments at
[HOSPITAL]. She does not have a surgeon
preference. At Dr. Trainer request, surgical and oncological
consultation will be arranged by the nurse navigators at the [HOSPITAL] [HOSPITAL] [HOSPITAL] .
Pathology and recommendations were relayed to Bambucafe Tarla, RN, nurse
navigator for [HOSPITAL] [HOSPITAL] by
Mutluyum, Tc Hamdi ([REDACTED]). An appointment has been
made with Dr. Ley, surgeon, on 02/22/16 at [DATE] and with Dr.
Kikufi, oncologist, on 02/23/16 at [DATE]. The patient has been
notified by the nurse navigator.
Addendum by Mutluyum, Tc Hamdi on 02/18/16.
CLINICAL DATA: Patient with left breast mass presents today for
ultrasound-guided biopsy.
EXAM:
ULTRASOUND GUIDED LEFT BREAST CORE NEEDLE BIOPSY

[Series 1: MG view · 0.07mm/px · 8 of 14 slices shown]
[im 1/14]
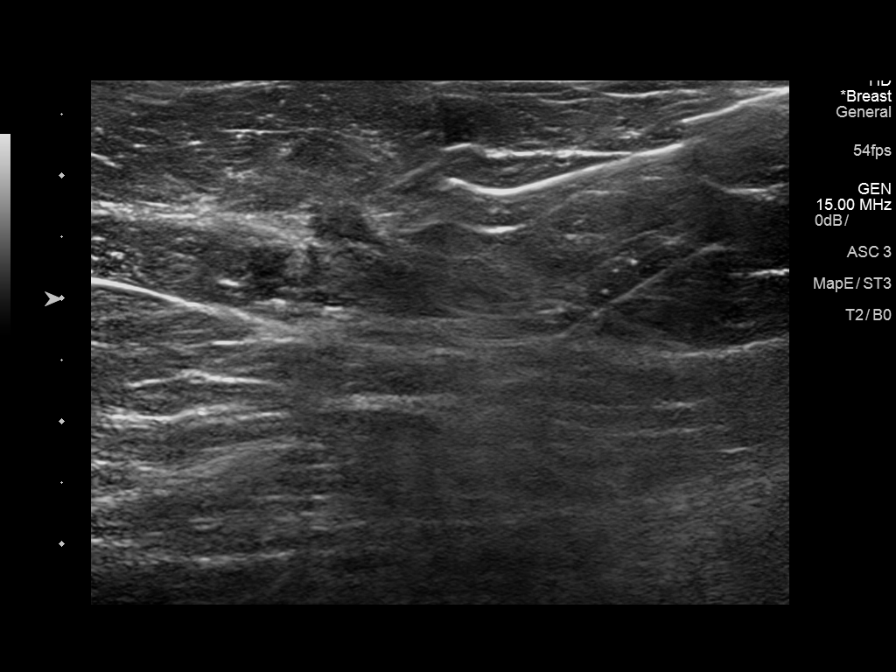
[im 2/14]
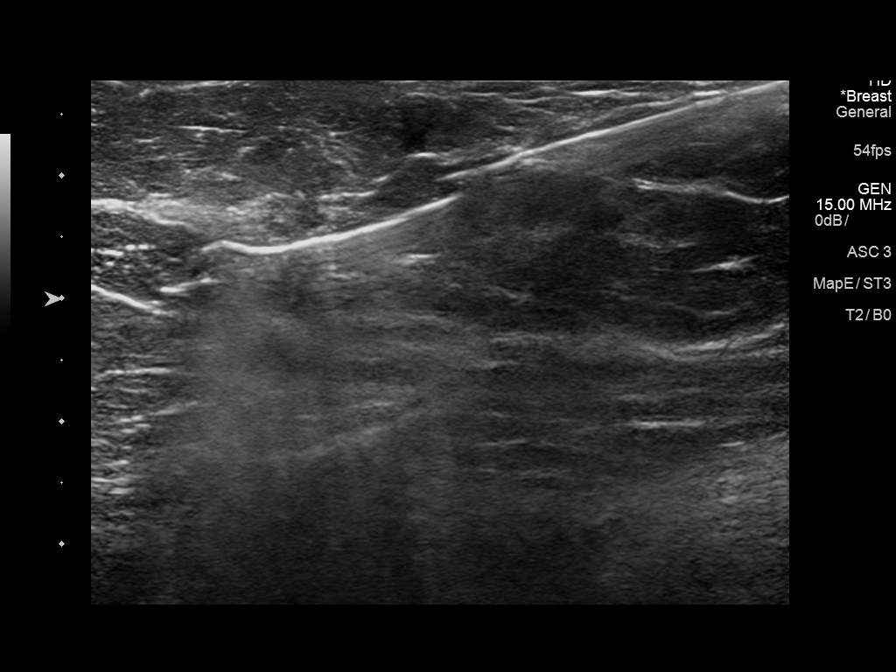
[im 4/14]
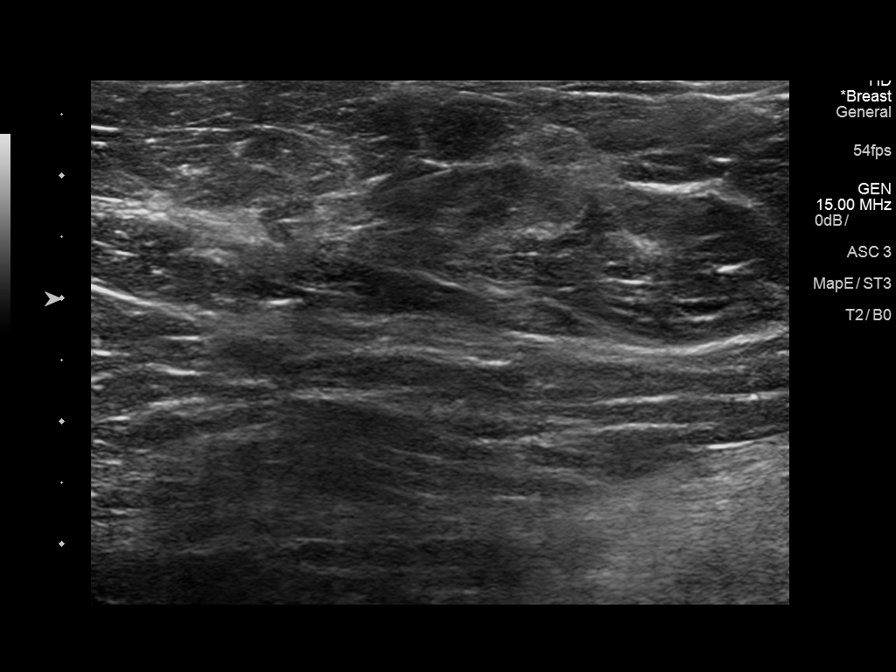
[im 6/14]
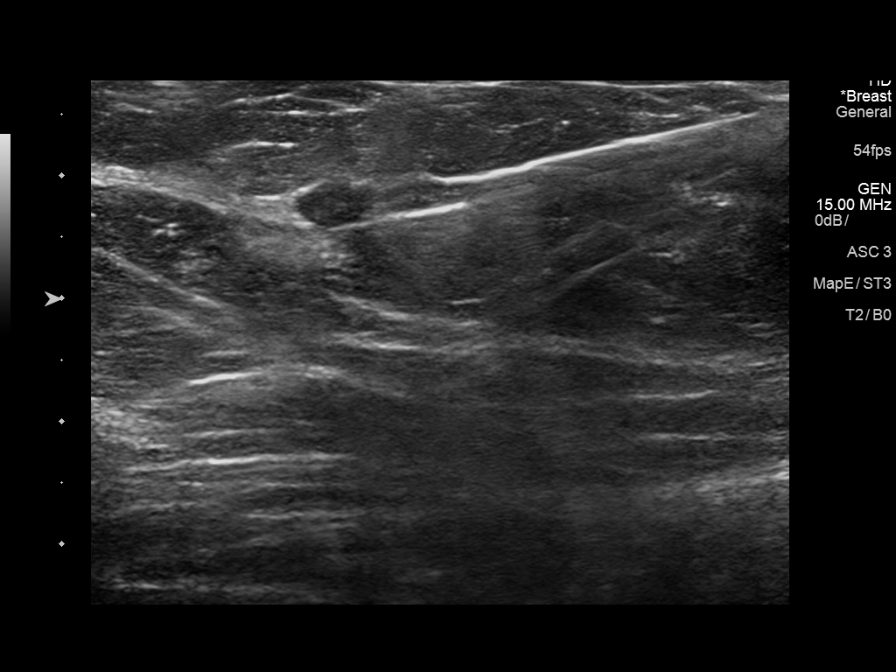
[im 8/14]
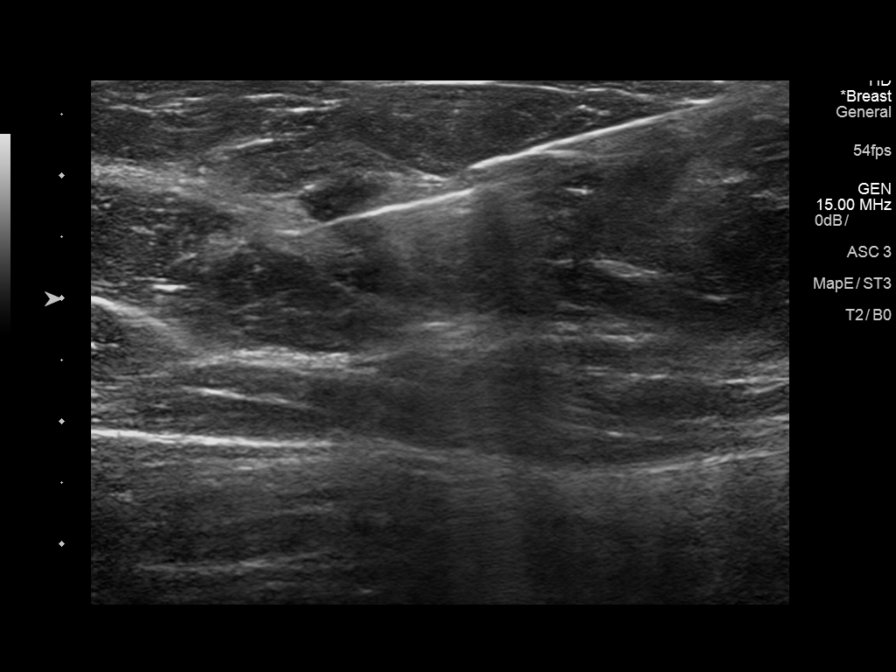
[im 10/14]
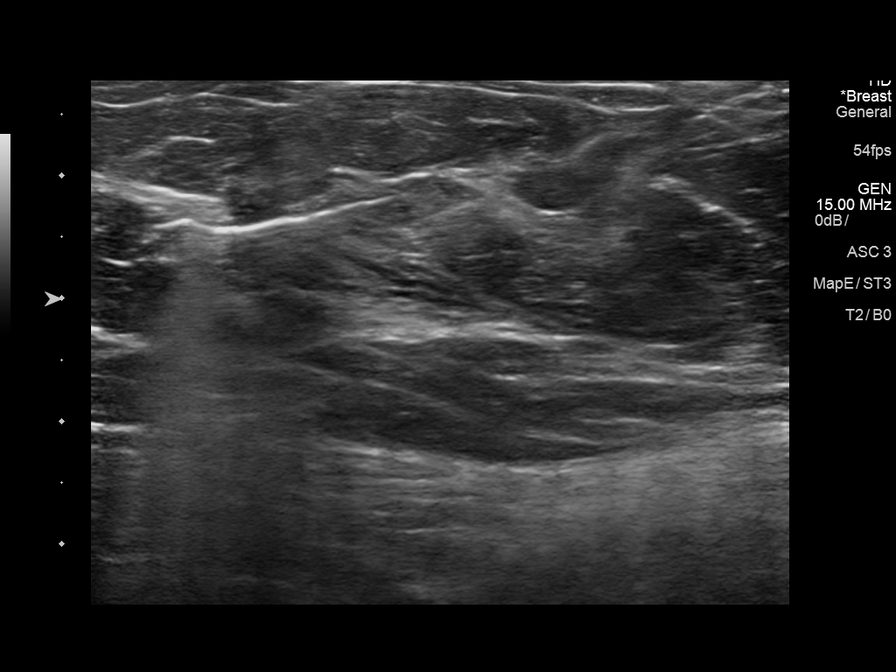
[im 12/14]
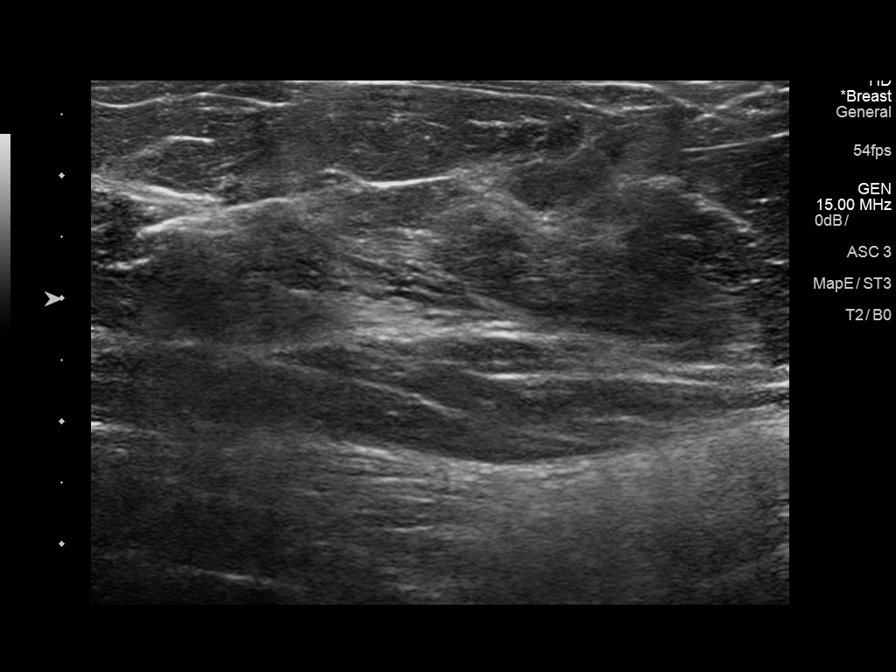
[im 14/14]
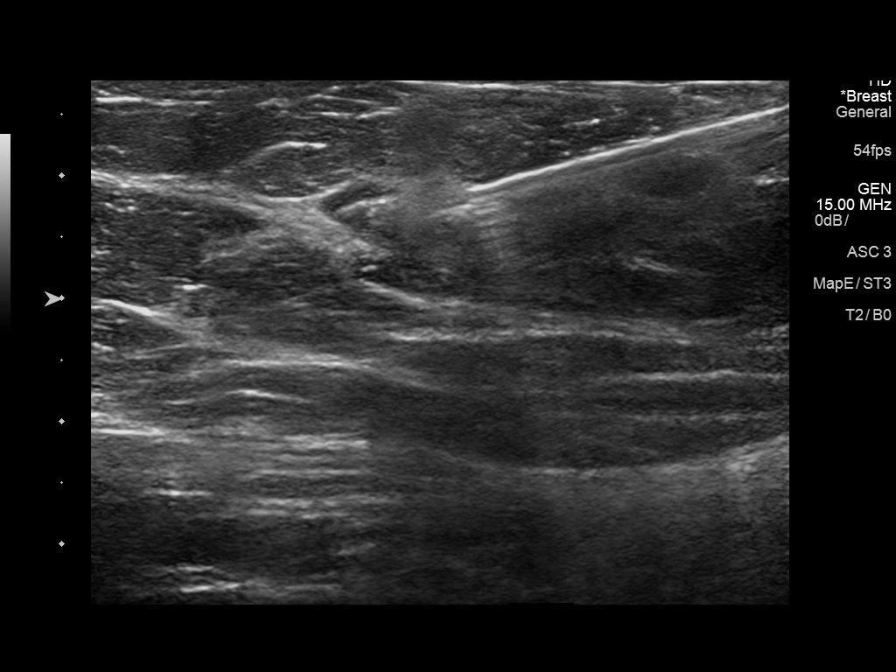

[8 of 8 positions shown; findings below may reference images not displayed]

PROCEDURE:
I met with the patient and we discussed the procedure of
ultrasound-guided biopsy, including benefits and alternatives. We
discussed the high likelihood of a successful procedure. We
discussed the risks of the procedure including infection, bleeding,
tissue injury, clip migration, and inadequate sampling. Informed
written consent was given. The usual time-out protocol was performed
immediately prior to the procedure.

Using sterile technique and 1% Lidocaine as local anesthetic, under
direct ultrasound visualization, a 12 gauge Herard device was
used to perform biopsy of the left breast mass at the 2 o'clock
axisusing a lateral approach. At the conclusion of the procedure, a
coil shaped tissue marker clip was deployed into the biopsy cavity.
Follow-up 2-view mammogram was performed and dictated separately.
IMPRESSION: Ultrasound-guided biopsy of the left breast mass at the 2 o'clock
axis. No apparent complications.

## 2017-06-22 IMAGING — CT CT HIP*L* W/O CM
1 series · 15 of 32 positions shown, 19 images · non-contrast
Comparison: None.

CLINICAL DATA: Left hip pain for 1 year. Rheumatoid arthritis.
Outside radiographs were reportedly abnormal.

EXAM:
CT OF THE LEFT HIP WITHOUT CONTRAST
TECHNIQUE: Multidetector CT imaging of the left hip was performed according to
the standard protocol. Multiplanar CT image reconstructions were
also generated.

[Series 2: axial bone · axial · 0.40mm/px · z∈[+196,+358]mm · 15 of 90 slices shown, 19 images]
[im 6/90  soft-tissue]
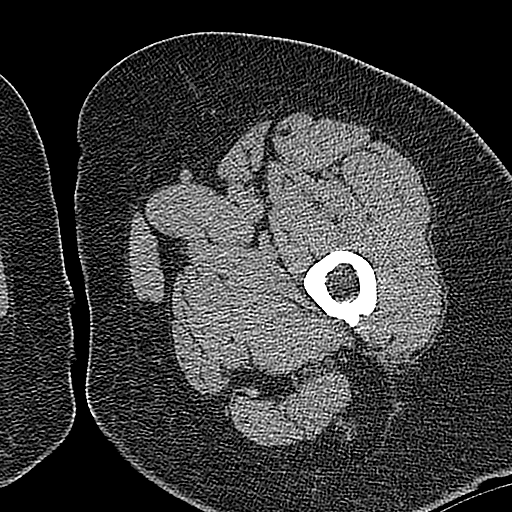
[im 6/90  bone]
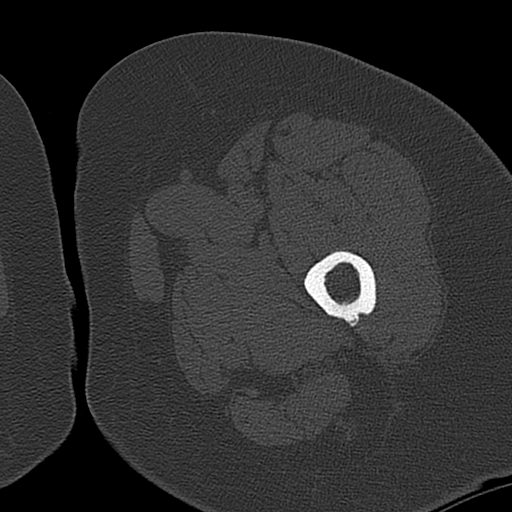
[im 12/90  soft-tissue]
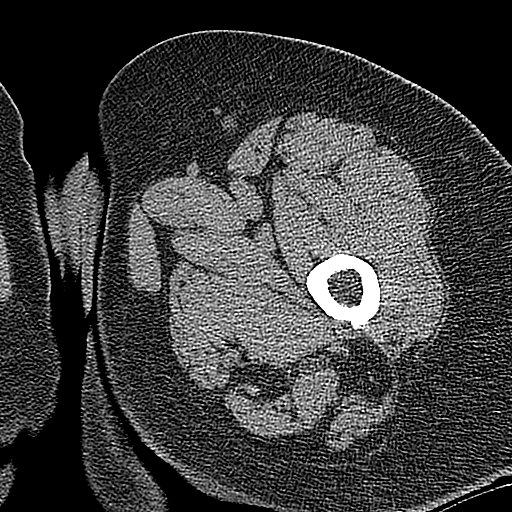
[im 18/90  soft-tissue]
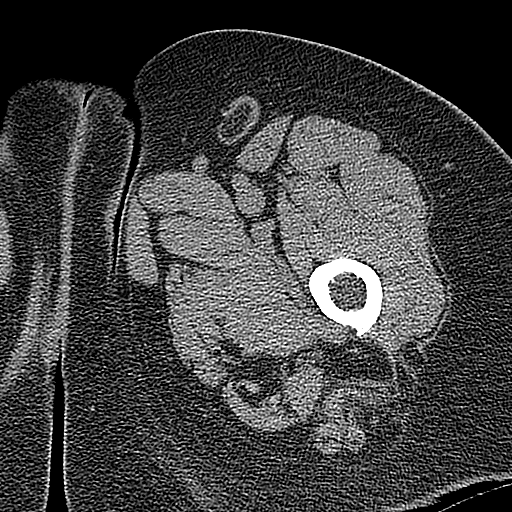
[im 26/90  soft-tissue]
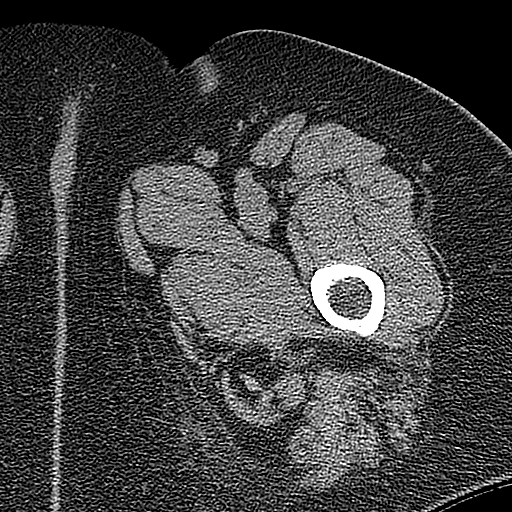
[im 32/90  soft-tissue]
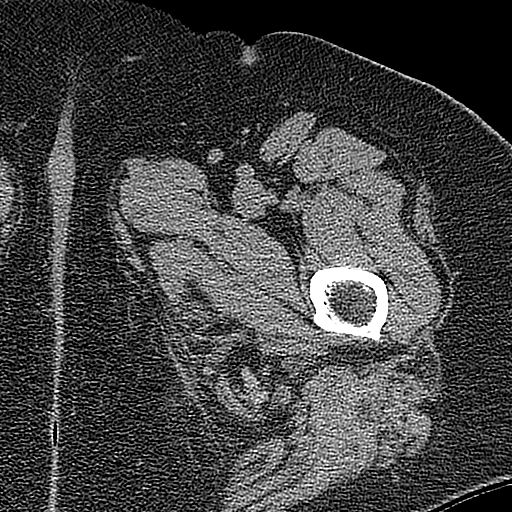
[im 38/90  soft-tissue]
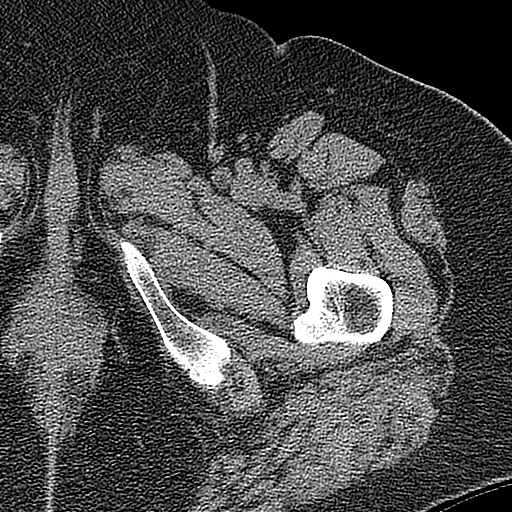
[im 46/90  soft-tissue]
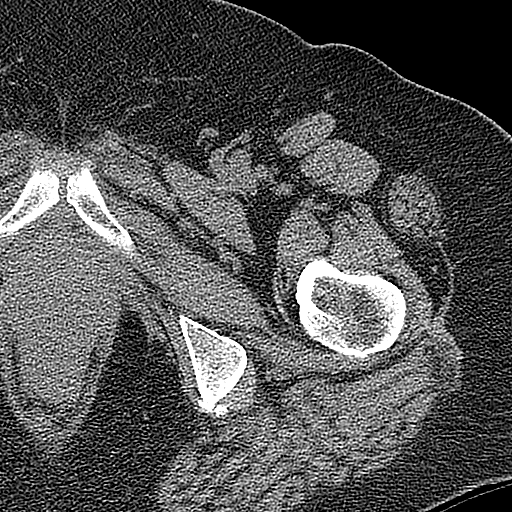
[im 52/90  soft-tissue]
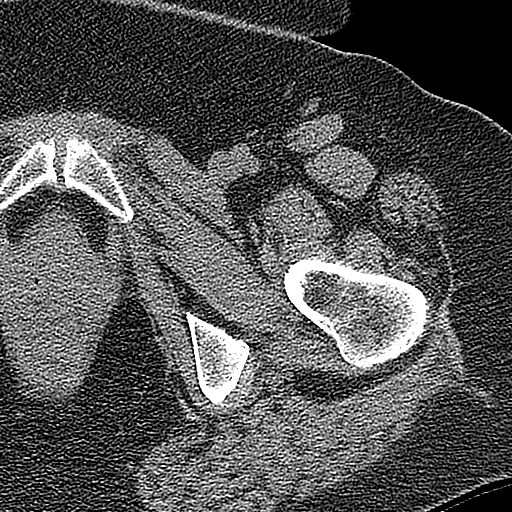
[im 58/90  soft-tissue]
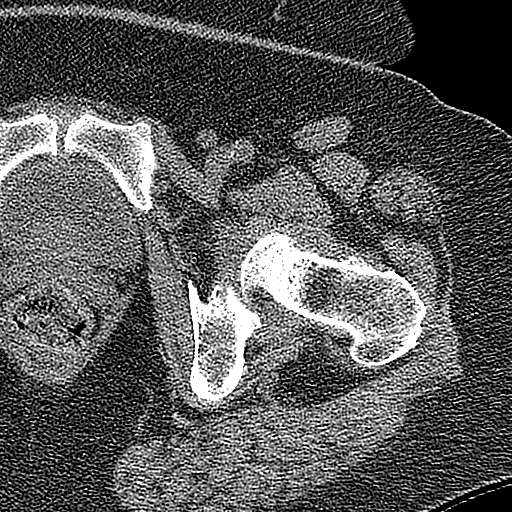
[im 58/90  bone]
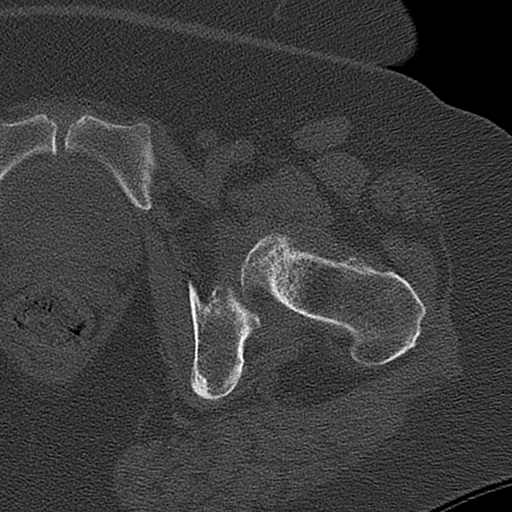
[im 64/90  soft-tissue]
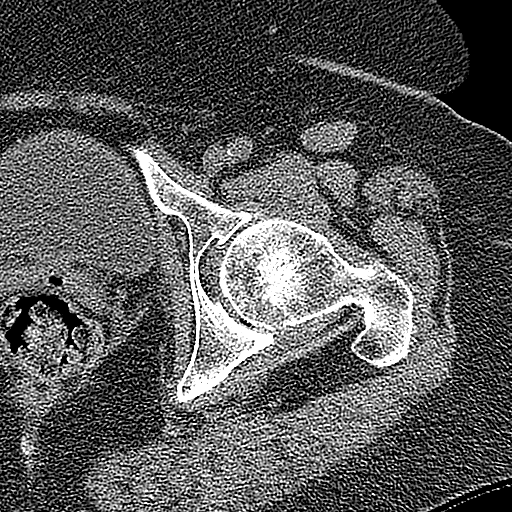
[im 72/90  soft-tissue]
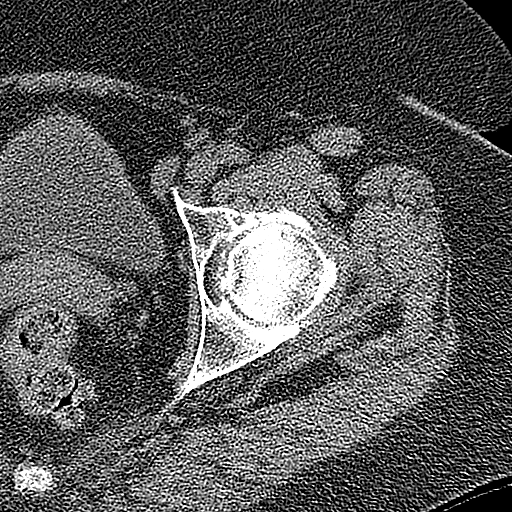
[im 78/90  soft-tissue]
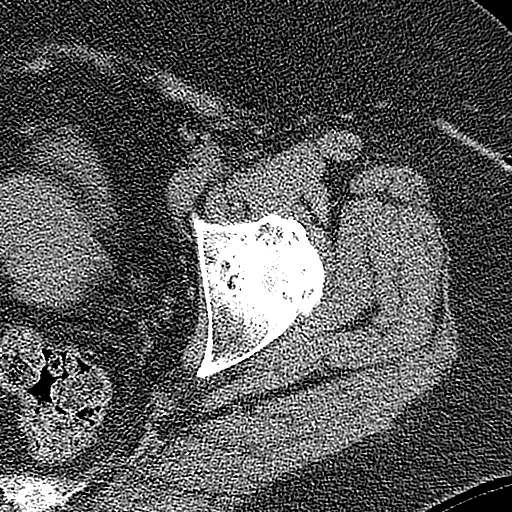
[im 78/90  lung]
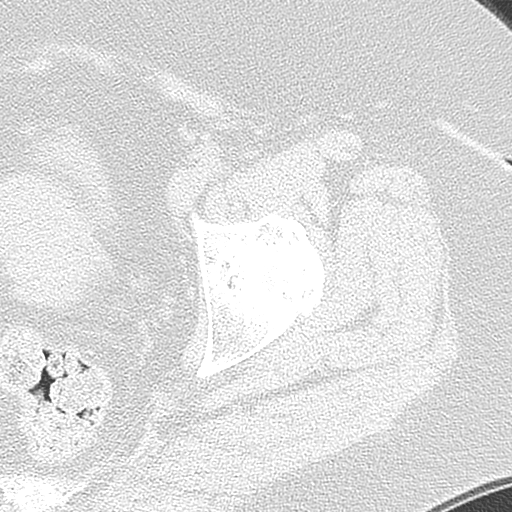
[im 81/90  lung]
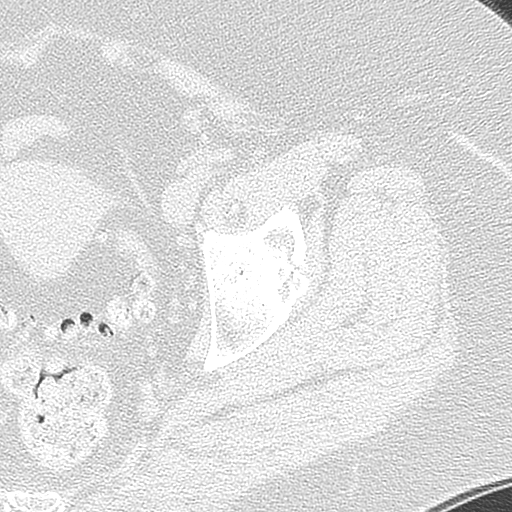
[im 84/90  soft-tissue]
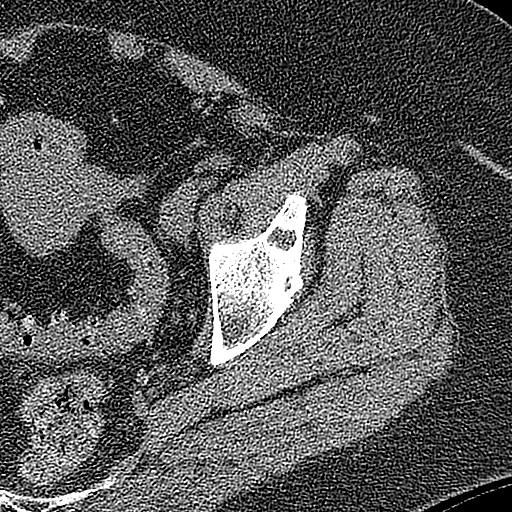
[im 84/90  lung]
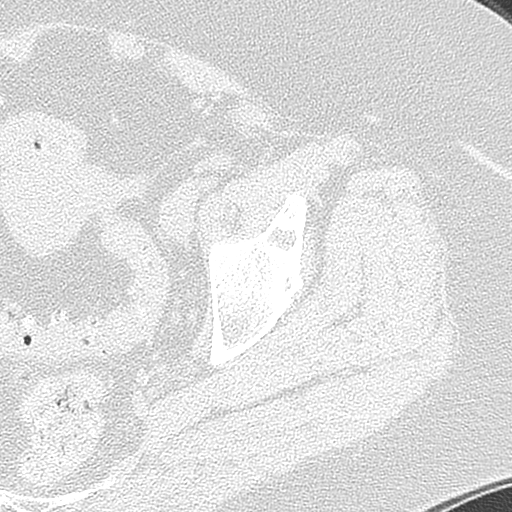
[im 87/90  lung]
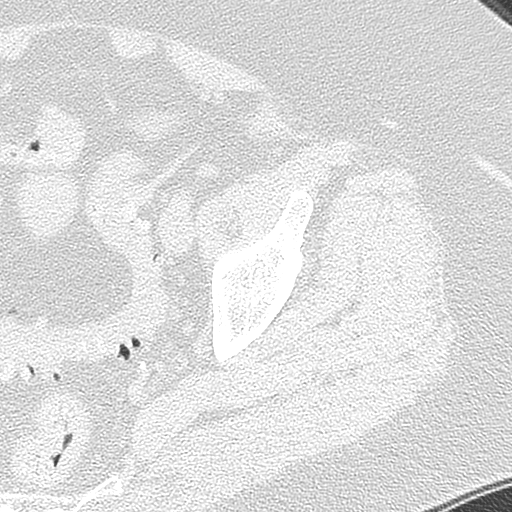

[15 of 32 positions shown; findings below may reference images not displayed]

FINDINGS: Bones/Joint/Cartilage

Severe craniocaudad and axial loss of articular space in the left
hip with prominent spurring of the acetabulum and femoral head.
Subcortical lucencies along the acetabulum are striking and
surrounded by sclerosis, and probably from erosions, less likely
geodes or degenerative subcortical cysts. Smaller and few or
subcortical lucencies are present along the left femoral head.

No CT evidence of avascular necrosis of the left hip. I do not
observe an acute fracture. There is potentially a very small hip
joint effusion although this is questionable. I do not observe
regional bursitis.

Ligaments

Suboptimally assessed by CT.

Muscles and Tendons

Unremarkable

Soft tissues

Sigmoid colon diverticulosis. Upper normal size left external iliac
node at 9 mm diameter. Atherosclerotic calcification of the left
common femoral artery.
IMPRESSION: 1. Severe arthropathy of the left hip including prominent spurring
the, and striking subcortical lucencies in the acetabulum favoring
erosions or less likely degenerative subcortical cysts/geodes.
Prominent loss of articular cartilage with axially and craniocaudad.
Appearance compatible with rheumatoid arthropathy.
2. Sigmoid colon diverticulosis.  Atherosclerosis.

## 2017-10-01 NOTE — Progress Notes (Signed)
[facility]  [phone] Fax:([phone]) [phone]  [name] OB: [birth year]  MR#: [id number]  GYB#:[id number]  Patient Care Team: Peggyann Juba, NP as PCP - General Peggyann Juba, NP as Nurse Practitioner Bary Castilla Forest Gleason, MD (General Surgery)  CHIEF COMPLAINT: Pathologic stage IA ER/PR positive, HER-2 negative invasive carcinoma of the upper outer quadrant of the left breast.  INTERVAL HISTORY: Patient returns to clinic today for routine six-month follow-up.  Since discontinuing letrozole and initiating anastrozole she no longer complains of hair loss.  She currently feels well and is asymptomatic. She has no neurologic complaints.  She denies any recent fevers or illnesses.  She has a good appetite and denies weight loss. She has no chest pain or shortness of breath. She denies any nausea, vomiting, constipation, or diarrhea. She has no urinary complaints.  Patient feels at her baseline offers no specific complaints today.  REVIEW OF SYSTEMS:   Review of Systems  Constitutional: Negative.  Negative for fever, malaise/fatigue and weight loss.  Respiratory: Negative.  Negative for cough and shortness of breath.   Cardiovascular: Negative.  Negative for chest pain and leg swelling.  Gastrointestinal: Negative.  Negative for abdominal pain and constipation.  Genitourinary: Negative.  Negative for hematuria.  Musculoskeletal: Negative.   Skin: Negative.  Negative for rash.  Neurological: Negative.  Negative for sensory change, focal weakness and weakness.  Psychiatric/Behavioral: Negative.  The patient is not nervous/anxious and does not have insomnia.    As per HPI. Otherwise, a complete review of systems is negative.   PAST MEDICAL HISTORY: Past Medical History:  Diagnosis Date  . Arthritis   . Breast cancer ([place]) 02/15/2016   INVASIVE MAMMARY CARCINOMA  . Cataract   . Hemorrhoids   . Hyperlipidemia   . Hypertension   . Osteoporosis      PAST SURGICAL HISTORY: Past Surgical History:  Procedure Laterality Date  . BREAST BIOPSY Left 02/15/2016   INVASIVE MAMMARY CARCINOMA  . BREAST LUMPECTOMY Left 2017  . BREAST LUMPECTOMY WITH SENTINEL LYMPH NODE BIOPSY Left 03/22/2016   Procedure: BREAST LUMPECTOMY WITH SENTINEL LYMPH NODE BX;  Surgeon: Robert Bellow, MD;  Location: [facility] ORS;  Service: General;  Laterality: Left;  . COLONOSCOPY  2016    FAMILY HISTORY: Family History  Problem Relation Age of Onset  . Colon cancer Mother   . Breast cancer Maternal Aunt   . Prostate cancer Father     ADVANCED DIRECTIVES (Y/N):  N  HEALTH MAINTENANCE: Social History   Tobacco Use  . Smoking status: Current Every Day Smoker    Packs/day: 1.00    Years: 40.00    Pack years: 40.00    Types: Cigarettes  . Smokeless tobacco: Never Used  Substance Use Topics  . Alcohol use: Yes    Alcohol/week: 1.2 oz    Types: 2 Glasses of wine per week  . Drug use: No     Colonoscopy:  PAP:  Bone density:  Lipid panel:  Allergies  Allergen Reactions  . Ibuprofen Swelling  . Sulfamethizole Rash    Other reaction(s): UNKNOWN    Current Outpatient Medications  Medication Sig Dispense Refill  . anastrozole (ARIMIDEX) 1 MG tablet Take 1 tablet (1 mg total) by mouth daily. 90 tablet 2  . aspirin EC 81 MG tablet Take 81 mg by mouth daily.    Marland Kitchen atorvastatin (LIPITOR) 20 MG tablet Take 20 mg by mouth daily at 6 PM.     . BONIVA  150 MG tablet TAKE AS INSTRUCTED BY YOUR PRESCRIBER 12 tablet 0  . calcium carbonate (OSCAL) 1500 (600 Ca) MG TABS tablet Take 1,500 mg by mouth daily with breakfast.    . hydroxychloroquine (PLAQUENIL) 200 MG tablet Take 400 mg by mouth daily.     Marland Kitchen losartan-hydrochlorothiazide (HYZAAR) 50-12.5 MG tablet Take 1 tablet by mouth daily.     . Multiple Vitamin (MULTIVITAMIN) tablet Take 1 tablet by mouth daily.    . naproxen (NAPROSYN) 500 MG tablet Take 500 mg by mouth 2 (two) times daily with a meal.    .  ranitidine (ZANTAC) 150 MG tablet Take 150 mg by mouth as needed.     Marland Kitchen letrozole (FEMARA) 2.5 MG tablet TAKE 1 TABLET DAILY 90 tablet 3   No current facility-administered medications for this visit.     OBJECTIVE: Vitals:   10/05/17 1346  BP: 131/89  Pulse: 99  Resp: 18  Temp: 98.3 F (36.8 C)     Body mass index is 30.04 kg/m.    ECOG FS:0 - Asymptomatic  General: Well-developed, well-nourished, no acute distress. Eyes: Pink conjunctiva, anicteric sclera. Breast: Patient declined breast exam today. Lungs: Clear to auscultation bilaterally. Heart: Regular rate and rhythm. No rubs, murmurs, or gallops. Abdomen: Soft, nontender, nondistended. No organomegaly noted, normoactive bowel sounds. Musculoskeletal: No edema, cyanosis, or clubbing. Neuro: Alert, answering all questions appropriately. Cranial nerves grossly intact. Skin: No rashes or petechiae noted. Psych: Normal affect.  LAB RESULTS:  Lab Results  Component Value Date   NA 141 03/18/2016   K 4.0 03/18/2016   CL 107 03/18/2016   CO2 24 03/18/2016   GLUCOSE 103 (H) 03/18/2016   BUN 21 (H) 03/18/2016   CREATININE 0.72 03/18/2016   CALCIUM 9.5 03/18/2016   GFRNONAA >60 03/18/2016   GFRAA >60 03/18/2016    Lab Results  Component Value Date   HGB 13.7 03/18/2016     STUDIES: No results found.  ASSESSMENT: Pathologic stage IA ER/PR positive, HER-2 negative invasive carcinoma of the upper outer quadrant of the left breast. Low risk MammoPrint.  PLAN:    1. Pathologic stage IA ER/PR positive, HER-2 negative invasive carcinoma of the upper outer quadrant of the left breast: Patient had her lumpectomy March 22, 2016.  She also was found to be a low risk MammoPrint, therefore did not require adjuvant chemotherapy. Patient competed MammoSite XRT.  Patient discontinued letrozole secondary to hair loss, but now is tolerating anastrozole well without significant side effects.  Continue treatment for a total of 5  years completing in November 2022. Her most recent mammogram on March 24, 2017 was reported as BI-RADS 2.  Repeat mammogram in November 2018 in conjunction with her repeat bone mineral density. 2. Osteopenia: Patient's most recent bone marrow density on May 02, 2017 reported T score of -2.2 which is essentially unchanged from one year prior.  Continue calcium and vitamin D supplementation.  Patient also takes Boniva every 6 months.  Repeat in November 2019 as above.  Approximately 20 minutes was spent in discussion of which greater than 50% was consultation.    Patient expressed understanding and was in agreement with this plan. She also understands that She can call clinic at any time with any questions, concerns, or complaints.   Cancer Staging Malignant neoplasm of female breast [facility]) Staging form: Breast, AJCC 7th Edition - Pathologic stage from 04/04/2016: Stage IA (T1b, N0, cM0) - Signed by Lloyd Huger, MD on 04/04/2016   Kathlene November  Grayland Ormond, MD 10/06/17 11:49 AM

## 2017-10-05 ENCOUNTER — Inpatient Hospital Stay: Attending: Oncology | Admitting: Oncology

## 2017-10-05 ENCOUNTER — Encounter: Payer: Self-pay | Admitting: Oncology

## 2017-10-05 ENCOUNTER — Other Ambulatory Visit: Payer: Self-pay

## 2017-10-05 VITALS — BP 131/89 | HR 99 | Temp 98.3°F | Resp 18 | Wt 191.8 lb

## 2017-10-05 DIAGNOSIS — Z170 Estrogen receptor positive status [ER+]: Secondary | ICD-10-CM | POA: Diagnosis not present

## 2017-10-05 DIAGNOSIS — C50412 Malignant neoplasm of upper-outer quadrant of left female breast: Secondary | ICD-10-CM

## 2017-10-05 DIAGNOSIS — M8580 Other specified disorders of bone density and structure, unspecified site: Secondary | ICD-10-CM | POA: Diagnosis not present

## 2017-10-05 NOTE — Progress Notes (Signed)
Here for follow up-doing well she stated w no s/ eff

## 2017-12-27 ENCOUNTER — Other Ambulatory Visit: Payer: Self-pay | Admitting: Oncology

## 2018-03-22 ENCOUNTER — Ambulatory Visit: Admitting: Radiation Oncology

## 2018-03-27 ENCOUNTER — Ambulatory Visit
Admission: RE | Admit: 2018-03-27 | Discharge: 2018-03-27 | Disposition: A | Discharge status: Home / Self Care | Source: Ambulatory Visit | Attending: Oncology | Admitting: Oncology

## 2018-03-27 DIAGNOSIS — Z170 Estrogen receptor positive status [ER+]: Secondary | ICD-10-CM | POA: Diagnosis present

## 2018-03-27 DIAGNOSIS — C50412 Malignant neoplasm of upper-outer quadrant of left female breast: Secondary | ICD-10-CM

## 2018-03-27 HISTORY — DX: Personal history of irradiation: Z92.3

## 2018-04-08 NOTE — Progress Notes (Signed)
Karen Phillips  [phone] [phone]  [name] OB: 1950-12-01  MR#: [id number]  HER#:[id number]  Patient Care Team: Peggyann Juba, NP as PCP - General Peggyann Juba, NP as Nurse Practitioner Bary Castilla Forest Gleason, MD (General Surgery)  CHIEF COMPLAINT: Pathologic stage IA ER/PR positive, HER-2 negative invasive carcinoma of the upper outer quadrant of the left breast.  INTERVAL HISTORY: Patient returns to clinic today for routine 48-monthevaluation.  She continues to tolerate anastrozole well without significant side effects.  She has noticed increased left hip pain, but still states she is able to walk or work out 3 times per week.  She otherwise feels well.  She has no neurologic complaints.  She denies any recent fevers or illnesses.  She has a good appetite and denies weight loss. She has no chest pain or shortness of breath. She denies any nausea, vomiting, constipation, or diarrhea. She has no urinary complaints.  Patient offers no further specific complaints today.  REVIEW OF SYSTEMS:   Review of Systems  Constitutional: Negative.  Negative for fever, malaise/fatigue and weight loss.  Respiratory: Negative.  Negative for cough and shortness of breath.   Cardiovascular: Negative.  Negative for chest pain and leg swelling.  Gastrointestinal: Negative.  Negative for abdominal pain and constipation.  Genitourinary: Negative.  Negative for hematuria.  Musculoskeletal: Positive for joint pain.  Skin: Negative.  Negative for rash.  Neurological: Negative.  Negative for sensory change, focal weakness and weakness.  Psychiatric/Behavioral: Negative.  The patient is not nervous/anxious and does not have insomnia.    As per HPI. Otherwise, a complete review of systems is negative.   PAST MEDICAL HISTORY: Past Medical History:  Diagnosis Date  . Arthritis   . Breast cancer ([place] 02/15/2016   INVASIVE MAMMARY CARCINOMA  . Cataract   .  Hemorrhoids   . Hyperlipidemia   . Hypertension   . Osteoporosis   . Personal history of radiation therapy 2017   MammoSite XRT    PAST SURGICAL HISTORY: Past Surgical History:  Procedure Laterality Date  . BREAST BIOPSY Left 02/15/2016   INVASIVE MAMMARY CARCINOMA  . BREAST LUMPECTOMY Left 2017   INVASIVE MAMMARY CARCINOMA  . BREAST LUMPECTOMY WITH SENTINEL LYMPH NODE BIOPSY Left 03/22/2016   Procedure: BREAST LUMPECTOMY WITH SENTINEL LYMPH NODE BX;  Surgeon: JRobert Bellow MD;  Location: [facility] ORS;  Service: General;  Laterality: Left;  . COLONOSCOPY  2016    FAMILY HISTORY: Family History  Problem Relation Age of Onset  . Colon cancer Mother   . Breast cancer Maternal Aunt   . Prostate cancer Father     ADVANCED DIRECTIVES (Y/N):  N  HEALTH MAINTENANCE: Social History   Tobacco Use  . Smoking status: Current Every Day Smoker    Packs/day: 1.00    Years: 40.00    Pack years: 40.00    Types: Cigarettes  . Smokeless tobacco: Never Used  Substance Use Topics  . Alcohol use: Yes    Alcohol/week: 2.0 standard drinks    Types: 2 Glasses of wine per week  . Drug use: No     Colonoscopy:  PAP:  Bone density:  Lipid panel:  Allergies  Allergen Reactions  . Ibuprofen Swelling  . Sulfamethizole Rash    Other reaction(s): UNKNOWN    Current Outpatient Medications  Medication Sig Dispense Refill  . anastrozole (ARIMIDEX) 1 MG tablet TAKE 1 TABLET DAILY 90 tablet 2  . aspirin EC 81 MG tablet  Take 81 mg by mouth daily.    Marland Kitchen atorvastatin (LIPITOR) 20 MG tablet Take 20 mg by mouth daily at 6 PM.     . BONIVA 150 MG tablet TAKE AS INSTRUCTED BY YOUR PRESCRIBER 12 tablet 0  . calcium carbonate (OSCAL) 1500 (600 Ca) MG TABS tablet Take 1,500 mg by mouth daily with breakfast.    . hydroxychloroquine (PLAQUENIL) 200 MG tablet Take 400 mg by mouth daily.     Marland Kitchen letrozole (FEMARA) 2.5 MG tablet TAKE 1 TABLET DAILY 90 tablet 3  . losartan-hydrochlorothiazide (HYZAAR)  50-12.5 MG tablet Take 1 tablet by mouth daily.     . Multiple Vitamin (MULTIVITAMIN) tablet Take 1 tablet by mouth daily.    . naproxen (NAPROSYN) 500 MG tablet Take 500 mg by mouth 2 (two) times daily with a meal.    . ranitidine (ZANTAC) 150 MG tablet Take 150 mg by mouth as needed.      No current facility-administered medications for this visit.     OBJECTIVE: Vitals:   04/10/18 1109  BP: 121/80  Pulse: 96  Resp: 18  Temp: (!) 95.5 F (35.3 C)     Body mass index is 28.57 kg/m.    ECOG FS:0 - Asymptomatic  General: Well-developed, well-nourished, no acute distress. Eyes: Pink conjunctiva, anicteric sclera. HEENT: Normocephalic, moist mucous membranes. Breast: Exam deferred today. Lungs: Clear to auscultation bilaterally. Heart: Regular rate and rhythm. No rubs, murmurs, or gallops. Abdomen: Soft, nontender, nondistended. No organomegaly noted, normoactive bowel sounds. Musculoskeletal: No edema, cyanosis, or clubbing. Neuro: Alert, answering all questions appropriately. Cranial nerves grossly intact. Skin: No rashes or petechiae noted. Psych: Normal affect.  LAB RESULTS:  Lab Results  Component Value Date   NA 141 03/18/2016   K 4.0 03/18/2016   CL 107 03/18/2016   CO2 24 03/18/2016   GLUCOSE 103 (H) 03/18/2016   BUN 21 (H) 03/18/2016   CREATININE 0.72 03/18/2016   CALCIUM 9.5 03/18/2016   GFRNONAA >60 03/18/2016   GFRAA >60 03/18/2016    Lab Results  Component Value Date   HGB 13.7 03/18/2016     STUDIES: Dg Bone Density  Result Date: 03/27/2018 EXAM: DUAL X-RAY ABSORPTIOMETRY (DXA) FOR BONE MINERAL DENSITY IMPRESSION: Technologist: KBD PATIENT BIOGRAPHICAL: Name: Karen, Phillips Patient ID: [id number] Birth Date: 05-08-1951 Height: 66.0 in. Gender: Female Exam Date: 03/27/2018 Weight: 184.0 lbs. Indications: Caucasian, History of Breast Cancer, History of Fracture (Adult), History of Radiation, Postmenopausal, Rheumatoid Arthritis, Tobacco  User(Current Smoker) Fractures: Right forearm Treatments: Anastrozole, Boniva, Calcium, Vitamin D ASSESSMENT: The BMD measured at AP Spine L1-L4 is 0.880 g/cm2 with a T-score of -2.5. This patient is considered osteoporotic according to [facility]) criteria. Scan quality is good. Site Region Measured Measured WHO Young Adult BMD Date       Age      Classification T-score AP Spine L1-L4 03/27/2018 64.9 Osteoporosis -2.5 0.880 g/cm2 AP Spine L1-L4 05/02/2017 64.0 Osteopenia -2.2 0.924 g/cm2 AP Spine L1-L4 10/02/2012 59.4 Osteopenia -2.3 0.905 g/cm2 DualFemur Neck Left 03/27/2018 64.9 Osteopenia -1.6 0.810 g/cm2 DualFemur Neck Left 05/02/2017 64.0 Osteopenia -1.9 0.773 g/cm2 DualFemur Neck Left 08/27/2014 61.3 Osteopenia -1.8 0.784 g/cm2 DualFemur Neck Left 10/02/2012 59.4 Osteopenia -1.5 0.823 g/cm2 DualFemur Total Mean 03/27/2018 64.9 Normal -0.9 0.889 g/cm2 DualFemur Total Mean 05/02/2017 64.0 Normal -1.0 0.878 g/cm2 DualFemur Total Mean 08/27/2014 61.3 Osteopenia -1.1 0.865 g/cm2 DualFemur Total Mean 10/02/2012 59.4 Normal -0.9 0.893 g/cm2 World Health Organization [facility]) criteria for post-menopausal, Caucasian Women:  Normal:       T-score at or above -1 SD Osteopenia:   T-score between -1 and -2.5 SD Osteoporosis: T-score at or below -2.5 SD RECOMMENDATIONS: 1. All patients should optimize calcium and vitamin D intake. 2. Consider FDA-approved medical therapies in postmenopausal women and men aged 109 years and older, based on the following: a. A hip or vertebral(clinical or morphometric) fracture b. T-score < -2.5 at the femoral neck or spine after appropriate evaluation to exclude secondary causes c. Low bone mass (T-score between -1.0 and -2.5 at the femoral neck or spine) and a 10-year probability of a hip fracture > 3% or a 10-year probability of a major osteoporosis-related fracture > 20% based on the [place]-adapted WHO algorithm d. Clinician judgment and/or patient preferences may indicate  treatment for people with 10-year fracture probabilities above or below these levels FOLLOW-UP: People with diagnosed cases of osteoporosis or at high risk for fracture should have regular bone mineral density tests. For patients eligible for Medicare, routine testing is allowed once every 2 years. The testing frequency can be increased to one year for patients who have rapidly progressing disease, those who are receiving or discontinuing medical therapy to restore bone mass, or have additional risk factors. I have reviewed this report, and agree with the above findings. [facility] Radiology Electronically Signed   By: Lowella Grip III M.D.   On: 03/27/2018 10:48   Mm Diag Breast Tomo Bilateral  Result Date: 03/27/2018 CLINICAL DATA:  History of LEFT breast cancer in 2017 status post lumpectomy and radiation therapy. EXAM: DIGITAL DIAGNOSTIC BILATERAL MAMMOGRAM WITH CAD AND TOMO COMPARISON:  Previous exam(s). ACR Breast Density Category b: There are scattered areas of fibroglandular density. FINDINGS: There are stable postsurgical changes within the LEFT breast. There are no new dominant masses, suspicious calcifications or secondary signs of malignancy within either breast. Mammographic images were processed with CAD. IMPRESSION: No evidence of malignancy within either breast. Stable postsurgical changes within the LEFT breast. RECOMMENDATION: Bilateral diagnostic mammogram in 1 year. I have discussed the findings and recommendations with the patient. Results were also provided in writing at the conclusion of the visit. If applicable, a reminder letter will be sent to the patient regarding the next appointment. BI-RADS CATEGORY  2: Benign. Electronically Signed   By: Franki Cabot M.D.   On: 03/27/2018 11:04    ASSESSMENT: Pathologic stage IA ER/PR positive, HER-2 negative invasive carcinoma of the upper outer quadrant of the left breast. Low risk MammoPrint.  PLAN:    1. Pathologic stage IA ER/PR  positive, HER-2 negative invasive carcinoma of the upper outer quadrant of the left breast: Patient had her lumpectomy March 22, 2016.  She also was found to be a low risk MammoPrint, therefore did not require adjuvant chemotherapy. Patient competed MammoSite XRT.  Patient discontinued letrozole secondary to hair loss, but now is tolerating anastrozole well without significant side effects.  Continue treatment for a total of 5 years completing in November 2022.  Patient's most recent mammogram on March 27, 2018 was reported as BI-RADS 2.  Repeat in October 2020.  Return to clinic in 6 months for further evaluation. 2.  Osteoporosis: Patient's most recent bone marrow density on March 27, 2018 reported T score of -2.5.  This is slightly worse than 1 year prior when the T score was reported -2.2.  Continue Boniva as well as calcium and vitamin D supplementation. 3.  Hip pain: We will get x-ray to further evaluate.  Continue naproxen  as prescribed by primary care.    Patient expressed understanding and was in agreement with this plan. She also understands that She can call clinic at any time with any questions, concerns, or complaints.   Cancer Staging Malignant neoplasm of female breast [facility]) Staging form: Breast, AJCC 7th Edition - Pathologic stage from 04/04/2016: Stage IA (T1b, N0, cM0) - Signed by Lloyd Huger, MD on 04/04/2016   Lloyd Huger, MD 04/10/18 11:39 AM

## 2018-04-10 ENCOUNTER — Ambulatory Visit
Admission: RE | Admit: 2018-04-10 | Discharge: 2018-04-10 | Disposition: A | Discharge status: Home / Self Care | Payer: Medicare Other | Source: Ambulatory Visit | Attending: Oncology | Admitting: Oncology

## 2018-04-10 ENCOUNTER — Other Ambulatory Visit: Payer: Self-pay | Admitting: Oncology

## 2018-04-10 ENCOUNTER — Encounter: Payer: Self-pay | Admitting: Radiation Oncology

## 2018-04-10 ENCOUNTER — Ambulatory Visit
Admission: RE | Admit: 2018-04-10 | Discharge: 2018-04-10 | Disposition: A | Discharge status: Home / Self Care | Payer: Medicare Other | Source: Ambulatory Visit | Attending: Radiation Oncology | Admitting: Radiation Oncology

## 2018-04-10 ENCOUNTER — Other Ambulatory Visit: Payer: Self-pay

## 2018-04-10 ENCOUNTER — Inpatient Hospital Stay: Payer: Medicare Other | Attending: Oncology | Admitting: Oncology

## 2018-04-10 VITALS — BP 121/80 | HR 96 | Temp 95.5°F | Resp 18 | Wt 182.4 lb

## 2018-04-10 DIAGNOSIS — Z79899 Other long term (current) drug therapy: Secondary | ICD-10-CM | POA: Diagnosis not present

## 2018-04-10 DIAGNOSIS — I10 Essential (primary) hypertension: Secondary | ICD-10-CM | POA: Diagnosis not present

## 2018-04-10 DIAGNOSIS — C50412 Malignant neoplasm of upper-outer quadrant of left female breast: Secondary | ICD-10-CM | POA: Diagnosis not present

## 2018-04-10 DIAGNOSIS — M25552 Pain in left hip: Secondary | ICD-10-CM | POA: Insufficient documentation

## 2018-04-10 DIAGNOSIS — M810 Age-related osteoporosis without current pathological fracture: Secondary | ICD-10-CM

## 2018-04-10 DIAGNOSIS — M899 Disorder of bone, unspecified: Secondary | ICD-10-CM

## 2018-04-10 DIAGNOSIS — F17210 Nicotine dependence, cigarettes, uncomplicated: Secondary | ICD-10-CM | POA: Diagnosis not present

## 2018-04-10 DIAGNOSIS — Z79811 Long term (current) use of aromatase inhibitors: Secondary | ICD-10-CM | POA: Insufficient documentation

## 2018-04-10 DIAGNOSIS — M1612 Unilateral primary osteoarthritis, left hip: Secondary | ICD-10-CM | POA: Insufficient documentation

## 2018-04-10 DIAGNOSIS — Z7982 Long term (current) use of aspirin: Secondary | ICD-10-CM | POA: Diagnosis not present

## 2018-04-10 DIAGNOSIS — Z170 Estrogen receptor positive status [ER+]: Secondary | ICD-10-CM | POA: Diagnosis not present

## 2018-04-10 DIAGNOSIS — Z923 Personal history of irradiation: Secondary | ICD-10-CM | POA: Insufficient documentation

## 2018-04-10 DIAGNOSIS — M25752 Osteophyte, left hip: Secondary | ICD-10-CM | POA: Insufficient documentation

## 2018-04-10 NOTE — Progress Notes (Signed)
Here for follow up. Per pt " doing well "   She stated that she is working out, and only c/o is left hip pain . MD aware of hip pain c/o

## 2018-04-10 NOTE — Progress Notes (Signed)
Radiation Oncology Follow up Note  Name: Karen Phillips   Date:   04/10/2018 MRN:  [id number] DOB: 09/06/1950    This 67 y.o. female presents to the clinic today for 2year follow-up status post accelerated partial breast irradiation to her left breast for stage I invasive mammary carcinoma.  REFERRING PROVIDER: Peggyann Juba, NP  HPI: patient is a 67 year old female now out 2 years having completed accelerated partial breast irradiation.to her left breast for stage I invasive mammary carcinoma ER/PR positive. She is seen today in routine follow up is doing well. She specifically denies breast tenderness cough or bone pain.mammograms performed last month which I have reviewed were BI-RADS 2 benign.she's currently on arimadex tolerating that well without side effect.  COMPLICATIONS OF TREATMENT: none  FOLLOW UP COMPLIANCE: keeps appointments   PHYSICAL EXAM:  BP (P) 128/81 (BP Location: Left Arm, Patient Position: Sitting)   Pulse (P) 98   Temp (!) (P) 97.5 F (36.4 C) (Tympanic)   Wt (P) 183 lb 1.5 oz (83 kg)   BMI (P) 28.68 kg/m  Lungs are clear to A&P cardiac examination essentially unremarkable with regular rate and rhythm. No dominant mass or nodularity is noted in either breast in 2 positions examined. Incision is well-healed. No axillary or supraclavicular adenopathy is appreciated. Cosmetic result is excellent.Well-developed well-nourished patient in NAD. HEENT reveals PERLA, EOMI, discs not visualized.  Oral cavity is clear. No oral mucosal lesions are identified. Neck is clear without evidence of cervical or supraclavicular adenopathy. Lungs are clear to A&P. Cardiac examination is essentially unremarkable with regular rate and rhythm without murmur rub or thrill. Abdomen is benign with no organomegaly or masses noted. Motor sensory and DTR levels are equal and symmetric in the upper and lower extremities. Cranial nerves II through XII are grossly intact. Proprioception is  intact. No peripheral adenopathy or edema is identified. No motor or sensory levels are noted. Crude visual fields are within normal range.  RADIOLOGY RESULTS: mammograms are reviewed and compatible with the above-stated findings  PLAN: at the present time she continues to do well with no evidence of disease. I'm please were overall progress. I've asked to see her back in 1 year for follow-up. She continues on arimadex without side effect. Patient is to call with any concerns.  I would like to take this opportunity to thank you for allowing me to participate in the care of your patient.Noreene Filbert, MD

## 2018-04-11 ENCOUNTER — Telehealth: Payer: Self-pay | Admitting: *Deleted

## 2018-04-11 NOTE — Telephone Encounter (Signed)
Pt notified of hip xray results from yesterday, xray showed arthritis. Pt verbalized understanding of results, no changes to follow up or plan at this time.

## 2018-04-12 ENCOUNTER — Ambulatory Visit (INDEPENDENT_AMBULATORY_CARE_PROVIDER_SITE_OTHER): Payer: Medicare Other | Admitting: General Surgery

## 2018-04-12 ENCOUNTER — Other Ambulatory Visit: Payer: Self-pay

## 2018-04-12 ENCOUNTER — Encounter: Payer: Self-pay | Admitting: General Surgery

## 2018-04-12 VITALS — BP 137/80 | HR 96 | Temp 97.2°F

## 2018-04-12 DIAGNOSIS — C50412 Malignant neoplasm of upper-outer quadrant of left female breast: Secondary | ICD-10-CM

## 2018-04-12 NOTE — Patient Instructions (Signed)
The patient has been asked to return to the office in one year with a bilateral diagnostic mammogram.The patient is aware to call back for any questions or concerns. 

## 2018-04-12 NOTE — Progress Notes (Signed)
Patient [name], female   DOB: September 21, 1950, 67 y.o.   MRN: [id number]  Chief Complaint  Patient presents with  . Follow-up    bil diag mammo [facility] c50.412 last mammo 03/27/18    HPI Karen Phillips is a 67 y.o. female who presents for a breast evaluation. The most recent mammogram was done on 03/27/2018.  Patient does perform regular self breast checks and gets regular mammograms done.  Patient denied any pain, nipple discharge, skin discoloration. Patient's left breast lumpectomy was done on 03/22/2016.  Recent onset of left hip pain evaluated with plain films earlier this month: Osteoarthritis and degenerative joint disease.  HPI  Past Medical History:  Diagnosis Date  . Arthritis   . Breast cancer ([place]) 02/15/2016   INVASIVE MAMMARY CARCINOMA  . Cataract   . Hemorrhoids   . Hyperlipidemia   . Hypertension   . Osteoporosis   . Personal history of radiation therapy 2017   MammoSite XRT    Past Surgical History:  Procedure Laterality Date  . BREAST BIOPSY Left 02/15/2016   INVASIVE MAMMARY CARCINOMA  . BREAST LUMPECTOMY Left 2017   INVASIVE MAMMARY CARCINOMA  . BREAST LUMPECTOMY WITH SENTINEL LYMPH NODE BIOPSY Left 03/22/2016   Procedure: BREAST LUMPECTOMY WITH SENTINEL LYMPH NODE BX;  Surgeon: Robert Bellow, MD;  Location: [facility] ORS;  Service: General;  Laterality: Left;  . COLONOSCOPY  2016    Family History  Problem Relation Age of Onset  . Colon cancer Mother   . Breast cancer Maternal Aunt   . Prostate cancer Father     Social History Social History   Tobacco Use  . Smoking status: Current Every Day Smoker    Packs/day: 1.00    Years: 40.00    Pack years: 40.00    Types: Cigarettes  . Smokeless tobacco: Never Used  Substance Use Topics  . Alcohol use: Yes    Alcohol/week: 2.0 standard drinks    Types: 2 Glasses of wine per week  . Drug use: No    Allergies  Allergen Reactions  . Ibuprofen Swelling  . Sulfamethizole Rash     Other reaction(s): UNKNOWN    Current Outpatient Medications  Medication Sig Dispense Refill  . anastrozole (ARIMIDEX) 1 MG tablet TAKE 1 TABLET DAILY 90 tablet 2  . aspirin EC 81 MG tablet Take 81 mg by mouth daily.    Marland Kitchen atorvastatin (LIPITOR) 20 MG tablet Take 20 mg by mouth daily at 6 PM.     . BONIVA 150 MG tablet TAKE AS INSTRUCTED BY YOUR PRESCRIBER 12 tablet 0  . calcium carbonate (OSCAL) 1500 (600 Ca) MG TABS tablet Take 1,500 mg by mouth daily with breakfast.    . hydroxychloroquine (PLAQUENIL) 200 MG tablet Take 400 mg by mouth daily.     Marland Kitchen letrozole (FEMARA) 2.5 MG tablet TAKE 1 TABLET DAILY 90 tablet 3  . losartan-hydrochlorothiazide (HYZAAR) 50-12.5 MG tablet Take 1 tablet by mouth daily.     . Multiple Vitamin (MULTIVITAMIN) tablet Take 1 tablet by mouth daily.    . naproxen (NAPROSYN) 500 MG tablet Take 500 mg by mouth 2 (two) times daily with a meal.    . ranitidine (ZANTAC) 150 MG tablet Take 150 mg by mouth as needed.      No current facility-administered medications for this visit.     Review of Systems Review of Systems  Constitutional: Negative.   Respiratory: Negative.   Cardiovascular: Negative.   Musculoskeletal: Negative.  Neurological: Negative.   Hematological: Negative.   Psychiatric/Behavioral: Negative.     Blood pressure 137/80, pulse 96, temperature (!) 97.2 F (36.2 C), temperature source Skin.  Physical Exam Physical Exam  Constitutional: She is oriented to person, place, and time. She appears well-developed and well-nourished.  Eyes: Conjunctivae are normal. No scleral icterus.  Neck: Normal range of motion.  Cardiovascular: Normal rate, regular rhythm and normal heart sounds.  Pulmonary/Chest: Effort normal and breath sounds normal. Right breast exhibits no inverted nipple, no mass, no nipple discharge, no skin change and no tenderness. Left breast exhibits no inverted nipple, no mass, no nipple discharge, no skin change and no tenderness.     Lymphadenopathy:    She has no cervical adenopathy.    She has no axillary adenopathy.       Right: No supraclavicular adenopathy present.       Left: No supraclavicular adenopathy present.  Neurological: She is alert and oriented to person, place, and time.  Skin: Skin is warm and dry.    Data Reviewed Bilateral diagnostic mammograms dated March 27, 2018 were reviewed.  Postsurgical change.  BI-RADS-2.  Assessment    Doing well now 2 years status post management of early stage breast cancer.  Good tolerance of antiestrogen therapy.    Plan  The patient has been asked to return to the office in one year with a bilateral diagnostic mammogram. The patient is aware to call back for any questions or concerns.   HPI, Physical Exam, Assessment and Plan have been scribed under the direction and in the presence of Hervey Ard, MD.  Gaspar Cola, CMA  I have completed the exam and reviewed the above documentation for accuracy and completeness.  I agree with the above.  Haematologist has been used and any errors in dictation or transcription are unintentional.  Hervey Ard, M.D., F.A.C.S.  [name] 04/13/2018, 4:45 AM

## 2018-04-13 ENCOUNTER — Encounter: Payer: Self-pay | Admitting: General Surgery

## 2018-04-26 DIAGNOSIS — M06041 Rheumatoid arthritis without rheumatoid factor, right hand: Secondary | ICD-10-CM | POA: Diagnosis not present

## 2018-04-26 DIAGNOSIS — I10 Essential (primary) hypertension: Secondary | ICD-10-CM | POA: Diagnosis not present

## 2018-04-26 DIAGNOSIS — M06042 Rheumatoid arthritis without rheumatoid factor, left hand: Secondary | ICD-10-CM | POA: Diagnosis not present

## 2018-04-26 DIAGNOSIS — Z170 Estrogen receptor positive status [ER+]: Secondary | ICD-10-CM | POA: Diagnosis not present

## 2018-04-26 DIAGNOSIS — Z23 Encounter for immunization: Secondary | ICD-10-CM | POA: Diagnosis not present

## 2018-04-26 DIAGNOSIS — C50912 Malignant neoplasm of unspecified site of left female breast: Secondary | ICD-10-CM | POA: Diagnosis not present

## 2018-04-26 DIAGNOSIS — M8580 Other specified disorders of bone density and structure, unspecified site: Secondary | ICD-10-CM | POA: Diagnosis not present

## 2018-05-10 ENCOUNTER — Ambulatory Visit: Admitting: Oncology

## 2018-05-17 ENCOUNTER — Other Ambulatory Visit: Payer: Self-pay | Admitting: *Deleted

## 2018-05-17 DIAGNOSIS — Z170 Estrogen receptor positive status [ER+]: Principal | ICD-10-CM

## 2018-05-17 DIAGNOSIS — M8580 Other specified disorders of bone density and structure, unspecified site: Secondary | ICD-10-CM

## 2018-05-17 DIAGNOSIS — C50412 Malignant neoplasm of upper-outer quadrant of left female breast: Secondary | ICD-10-CM

## 2018-05-17 MED ORDER — IBANDRONATE SODIUM 150 MG PO TABS: ORAL_TABLET | ORAL | 0 refills | Status: DC

## 2018-09-05 DIAGNOSIS — M150 Primary generalized (osteo)arthritis: Secondary | ICD-10-CM | POA: Diagnosis not present

## 2018-09-05 DIAGNOSIS — Z79899 Other long term (current) drug therapy: Secondary | ICD-10-CM | POA: Diagnosis not present

## 2018-09-05 DIAGNOSIS — M2550 Pain in unspecified joint: Secondary | ICD-10-CM | POA: Diagnosis not present

## 2018-09-05 DIAGNOSIS — Z6829 Body mass index (BMI) 29.0-29.9, adult: Secondary | ICD-10-CM | POA: Diagnosis not present

## 2018-09-05 DIAGNOSIS — E663 Overweight: Secondary | ICD-10-CM | POA: Diagnosis not present

## 2018-09-05 DIAGNOSIS — M064 Inflammatory polyarthropathy: Secondary | ICD-10-CM | POA: Diagnosis not present

## 2018-09-23 ENCOUNTER — Other Ambulatory Visit: Payer: Self-pay | Admitting: Oncology

## 2018-10-07 NOTE — Progress Notes (Signed)
[place]  [phone] [phone]) [phone]  ID: REA KALAMA OB: 01-Oct-1950  MR#: [id number]  EYE#:[id number]  Patient Care Team: Peggyann Juba, NP as PCP - General Peggyann Juba, NP as Nurse Practitioner Bary Castilla Forest Gleason, MD (General Surgery)  I connected with Elray Mcgregor on 10/09/18 at 10:15 AM EDT by telephone visit and verified that I am speaking with the correct person using two identifiers.   I discussed the limitations, risks, security and privacy concerns of performing an evaluation and management service by telemedicine and the availability of in-person appointments. I also discussed with the patient that there may be a patient responsible charge related to this service. The patient expressed understanding and agreed to proceed.   Other persons participating in the visit and their role in the encounter: MD, patient  Patient's location: Home Provider's location: Clinic  CHIEF COMPLAINT: Pathologic stage IA ER/PR positive, HER-2 negative invasive carcinoma of the upper outer quadrant of the left breast.  INTERVAL HISTORY: Patient agreed to evaluation via telephone for her routine 62-monthappointment.  She currently feels well and is asymptomatic.  She continues to tolerate anastrozole without significant side effects. She has no neurologic complaints.  She denies any recent fevers or illnesses.  She has a good appetite and denies weight loss.  She denies any chest pain, shortness of breath, cough, or hemoptysis.  She denies any nausea, vomiting, constipation, or diarrhea. She has no urinary complaints.  Patient feels at her baseline offers no specific complaints today.  REVIEW OF SYSTEMS:   Review of Systems  Constitutional: Negative.  Negative for fever, malaise/fatigue and weight loss.  Respiratory: Negative.  Negative for cough and shortness of breath.   Cardiovascular: Negative.  Negative for chest pain and leg swelling.   Gastrointestinal: Negative.  Negative for abdominal pain and constipation.  Genitourinary: Negative.  Negative for hematuria.  Musculoskeletal: Negative.  Negative for joint pain.  Skin: Negative.  Negative for rash.  Neurological: Negative.  Negative for sensory change, focal weakness and weakness.  Psychiatric/Behavioral: Negative.  The patient is not nervous/anxious and does not have insomnia.    As per HPI. Otherwise, a complete review of systems is negative.   PAST MEDICAL HISTORY: Past Medical History:  Diagnosis Date  . Arthritis   . [place]   pT1b, N0; ER 90%, PR 90%, HER-2/neu not overexpressed. Closest margin 1 mm.Mammoprint: Low riisk.  . Cataract   . Hemorrhoids   . Hyperlipidemia   . Hypertension   . Osteoporosis   . Personal history of radiation therapy 2017   MammoSite XRT    PAST SURGICAL HISTORY: Past Surgical History:  Procedure Laterality Date  . BREAST BIOPSY Left 02/15/2016   INVASIVE MAMMARY CARCINOMA  . BREAST LUMPECTOMY WITH SENTINEL LYMPH NODE BIOPSY Left 03/22/2016   Procedure: BREAST LUMPECTOMY WITH SENTINEL LYMPH NODE BX;  Surgeon: JRobert Bellow MD;  Location: [facility] ORS;  Service: General;  Laterality: Left;  . COLONOSCOPY  2016    FAMILY HISTORY: Family History  Problem Relation Age of Onset  . Colon cancer Mother   . Breast cancer Maternal Aunt   . Prostate cancer Father     ADVANCED DIRECTIVES (Y/N):  N  HEALTH MAINTENANCE: Social History   Tobacco Use  . Smoking status: Current Every Day Smoker    Packs/day: 1.00    Years: 40.00    Pack years: 40.00    Types: Cigarettes  . Smokeless tobacco: Never Used  Substance Use Topics  . Alcohol use: Yes    Alcohol/week: 2.0 standard drinks    Types: 2 Glasses of wine per week  . Drug use: No     Colonoscopy:  PAP:  Bone density:  Lipid panel:  Allergies  Allergen Reactions  . Ibuprofen Swelling  . Sulfamethizole Rash    Other reaction(s): UNKNOWN     Current Outpatient Medications  Medication Sig Dispense Refill  . anastrozole (ARIMIDEX) 1 MG tablet TAKE 1 TABLET DAILY 90 tablet 3  . aspirin EC 81 MG tablet Take 81 mg by mouth daily.    Marland Kitchen atorvastatin (LIPITOR) 20 MG tablet Take 20 mg by mouth daily at 6 PM.     . calcium carbonate (OSCAL) 1500 (600 Ca) MG TABS tablet Take 1,500 mg by mouth daily with breakfast.    . hydroxychloroquine (PLAQUENIL) 200 MG tablet Take 400 mg by mouth daily.     Marland Kitchen ibandronate (BONIVA) 150 MG tablet TAKE AS INSTRUCTED BY YOUR PRESCRIBER 12 tablet 0  . losartan-hydrochlorothiazide (HYZAAR) 50-12.5 MG tablet Take 1 tablet by mouth daily.     . meloxicam (MOBIC) 15 MG tablet Take 15 mg by mouth daily.    . Multiple Vitamin (MULTIVITAMIN) tablet Take 1 tablet by mouth daily.    . ranitidine (ZANTAC) 150 MG tablet Take 150 mg by mouth as needed.      No current facility-administered medications for this visit.     OBJECTIVE: There were no vitals filed for this visit.   There is no height or weight on file to calculate BMI.    ECOG FS:0 - Asymptomatic   LAB RESULTS:  Lab Results  Component Value Date   NA 141 03/18/2016   K 4.0 03/18/2016   CL 107 03/18/2016   CO2 24 03/18/2016   GLUCOSE 103 (H) 03/18/2016   BUN 21 (H) 03/18/2016   CREATININE 0.72 03/18/2016   CALCIUM 9.5 03/18/2016   GFRNONAA >60 03/18/2016   GFRAA >60 03/18/2016    Lab Results  Component Value Date   HGB 13.7 03/18/2016     STUDIES: No results found.  ASSESSMENT: Pathologic stage IA ER/PR positive, HER-2 negative invasive carcinoma of the upper outer quadrant of the left breast. Low risk MammoPrint.  PLAN:    1. Pathologic stage IA ER/PR positive, HER-2 negative invasive carcinoma of the upper outer quadrant of the left breast: Patient had her lumpectomy March 22, 2016.  She also was found to be a low risk MammoPrint, therefore did not require adjuvant chemotherapy. Patient competed MammoSite XRT.  Patient  discontinued letrozole secondary to hair loss, but now is tolerating anastrozole well without significant side effects.  Continue anastrozole for a total of 5 years completing in November 2022.  Her most recent mammogram on March 27, 2018 was reported as BI-RADS 2.  Repeat in October 2020.  Return to clinic in 6 months for routine evaluation. 2.  Osteoporosis: Patient's most recent bone marrow density on March 27, 2018 reported T score of -2.5.  This is slightly worse than 1 year prior when the T score was reported -2.2.  Continue Boniva as well as calcium and vitamin D supplementation.  Repeat in October 2020 along with mammogram as above.   I provided 15 minutes of non face-to-face telephone visit time during this encounter, and > 50% was spent counseling as documented under my assessment & plan.  Patient expressed understanding and was in agreement with this plan. She also understands that She can  call clinic at any time with any questions, concerns, or complaints.   Cancer Staging Malignant neoplasm of female breast [facility]) Staging form: Breast, AJCC 7th Edition - Pathologic stage from 04/04/2016: Stage IA (T1b, N0, cM0) - Signed by Lloyd Huger, MD on 04/04/2016   Lloyd Huger, MD 10/09/18 1:31 PM

## 2018-10-09 ENCOUNTER — Other Ambulatory Visit: Payer: Self-pay

## 2018-10-09 ENCOUNTER — Encounter: Payer: Self-pay | Admitting: Oncology

## 2018-10-09 ENCOUNTER — Inpatient Hospital Stay: Payer: Medicare Other | Attending: Oncology | Admitting: Oncology

## 2018-10-09 DIAGNOSIS — C50412 Malignant neoplasm of upper-outer quadrant of left female breast: Secondary | ICD-10-CM | POA: Diagnosis not present

## 2018-10-09 DIAGNOSIS — Z170 Estrogen receptor positive status [ER+]: Secondary | ICD-10-CM | POA: Diagnosis not present

## 2018-10-09 NOTE — Progress Notes (Signed)
Spoke with patient to start telephone visit for today. Patient denies any concerns.

## 2018-12-25 ENCOUNTER — Encounter: Payer: Self-pay | Admitting: General Surgery

## 2019-01-24 DIAGNOSIS — M06041 Rheumatoid arthritis without rheumatoid factor, right hand: Secondary | ICD-10-CM | POA: Diagnosis not present

## 2019-01-24 DIAGNOSIS — M06042 Rheumatoid arthritis without rheumatoid factor, left hand: Secondary | ICD-10-CM | POA: Diagnosis not present

## 2019-01-24 DIAGNOSIS — Z170 Estrogen receptor positive status [ER+]: Secondary | ICD-10-CM | POA: Diagnosis not present

## 2019-01-24 DIAGNOSIS — I10 Essential (primary) hypertension: Secondary | ICD-10-CM | POA: Diagnosis not present

## 2019-01-24 DIAGNOSIS — K219 Gastro-esophageal reflux disease without esophagitis: Secondary | ICD-10-CM | POA: Diagnosis not present

## 2019-01-24 DIAGNOSIS — C50912 Malignant neoplasm of unspecified site of left female breast: Secondary | ICD-10-CM | POA: Diagnosis not present

## 2019-01-24 DIAGNOSIS — Z0000 Encounter for general adult medical examination without abnormal findings: Secondary | ICD-10-CM | POA: Diagnosis not present

## 2019-01-24 DIAGNOSIS — E7800 Pure hypercholesterolemia, unspecified: Secondary | ICD-10-CM | POA: Diagnosis not present

## 2019-01-24 DIAGNOSIS — Z1289 Encounter for screening for malignant neoplasm of other sites: Secondary | ICD-10-CM | POA: Diagnosis not present

## 2019-01-24 DIAGNOSIS — Z1159 Encounter for screening for other viral diseases: Secondary | ICD-10-CM | POA: Diagnosis not present

## 2019-02-05 DIAGNOSIS — Z800 Family history of malignant neoplasm of digestive organs: Secondary | ICD-10-CM | POA: Diagnosis not present

## 2019-02-05 DIAGNOSIS — K219 Gastro-esophageal reflux disease without esophagitis: Secondary | ICD-10-CM | POA: Diagnosis not present

## 2019-03-07 DIAGNOSIS — Z79899 Other long term (current) drug therapy: Secondary | ICD-10-CM | POA: Diagnosis not present

## 2019-03-07 DIAGNOSIS — E663 Overweight: Secondary | ICD-10-CM | POA: Diagnosis not present

## 2019-03-07 DIAGNOSIS — M064 Inflammatory polyarthropathy: Secondary | ICD-10-CM | POA: Diagnosis not present

## 2019-03-07 DIAGNOSIS — Z6829 Body mass index (BMI) 29.0-29.9, adult: Secondary | ICD-10-CM | POA: Diagnosis not present

## 2019-03-07 DIAGNOSIS — M2550 Pain in unspecified joint: Secondary | ICD-10-CM | POA: Diagnosis not present

## 2019-03-07 DIAGNOSIS — M150 Primary generalized (osteo)arthritis: Secondary | ICD-10-CM | POA: Diagnosis not present

## 2019-03-31 NOTE — Progress Notes (Signed)
[facility]  [phone]) [phone] Fax:(336) [phone]  [name] OB: 1951-04-08  MR#: [id number]  ZWC#:[id number]  Patient Care Team: Peggyann Juba, NP as PCP - General Peggyann Juba, NP as Nurse Practitioner Bary Castilla Forest Gleason, MD (General Surgery)   CHIEF COMPLAINT: Pathologic stage IA ER/PR positive, HER-2 negative invasive carcinoma of the upper outer quadrant of the left breast.  INTERVAL HISTORY: Patient returns to clinic today for routine 21-monthevaluation.  She continues to feel well and remains asymptomatic.  She continues to tolerate anastrozole well without significant side effects. She has no neurologic complaints.  She denies any recent fevers or illnesses.  She has a good appetite and denies weight loss.  She denies any chest pain, shortness of breath, cough, or hemoptysis.  She denies any nausea, vomiting, constipation, or diarrhea. She has no urinary complaints.  Patient offers no specific complaints today.  REVIEW OF SYSTEMS:   Review of Systems  Constitutional: Negative.  Negative for fever, malaise/fatigue and weight loss.  Respiratory: Negative.  Negative for cough and shortness of breath.   Cardiovascular: Negative.  Negative for chest pain and leg swelling.  Gastrointestinal: Negative.  Negative for abdominal pain and constipation.  Genitourinary: Negative.  Negative for hematuria.  Musculoskeletal: Negative.  Negative for joint pain.  Skin: Negative.  Negative for rash.  Neurological: Negative.  Negative for sensory change, focal weakness and weakness.  Psychiatric/Behavioral: Negative.  The patient is not nervous/anxious and does not have insomnia.    As per HPI. Otherwise, a complete review of systems is negative.   PAST MEDICAL HISTORY: Past Medical History:  Diagnosis Date   Arthritis    [place]   pT1b, N0; ER 90%, PR 90%, HER-2/neu not overexpressed. Closest margin 1 mm.Mammoprint: Low riisk.    Cataract    Hemorrhoids    Hyperlipidemia    Hypertension    Osteoporosis    Personal history of radiation therapy 2017   MammoSite XRT    PAST SURGICAL HISTORY: Past Surgical History:  Procedure Laterality Date   BREAST BIOPSY Left 02/15/2016   INVASIVE MAMMARY CARCINOMA   BREAST LUMPECTOMY WITH SENTINEL LYMPH NODE BIOPSY Left 03/22/2016   Procedure: BREAST LUMPECTOMY WITH SENTINEL LYMPH NODE BX;  Surgeon: JRobert Bellow MD;  Location: [facility] ORS;  Service: General;  Laterality: Left;   COLONOSCOPY  2016    FAMILY HISTORY: Family History  Problem Relation Age of Onset   Colon cancer Mother    Breast cancer Maternal Aunt    Prostate cancer Father     ADVANCED DIRECTIVES (Y/N):  N  HEALTH MAINTENANCE: Social History   Tobacco Use   Smoking status: Current Every Day Smoker    Packs/day: 1.00    Years: 40.00    Pack years: 40.00    Types: Cigarettes   Smokeless tobacco: Never Used  Substance Use Topics   Alcohol use: Yes    Alcohol/week: 2.0 standard drinks    Types: 2 Glasses of wine per week   Drug use: No     Colonoscopy:  PAP:  Bone density:  Lipid panel:  Allergies  Allergen Reactions   Ibuprofen Swelling   Sulfamethizole Rash    Other reaction(s): UNKNOWN    Current Outpatient Medications  Medication Sig Dispense Refill   anastrozole (ARIMIDEX) 1 MG tablet TAKE 1 TABLET DAILY 90 tablet 3   aspirin EC 81 MG tablet Take 81 mg by mouth daily.     atorvastatin (LIPITOR) 20  MG tablet Take 20 mg by mouth daily at 6 PM.      calcium carbonate (OSCAL) 1500 (600 Ca) MG TABS tablet Take 1,500 mg by mouth daily with breakfast.     hydroxychloroquine (PLAQUENIL) 200 MG tablet Take 400 mg by mouth daily.      ibandronate (BONIVA) 150 MG tablet TAKE AS INSTRUCTED BY YOUR PRESCRIBER 12 tablet 0   losartan-hydrochlorothiazide (HYZAAR) 50-12.5 MG tablet Take 1 tablet by mouth daily.      meloxicam (MOBIC) 15 MG tablet Take 15 mg by  mouth daily.     Multiple Vitamin (MULTIVITAMIN) tablet Take 1 tablet by mouth daily.     omeprazole (PRILOSEC) 20 MG capsule Take 20 mg by mouth 2 (two) times daily.     ranitidine (ZANTAC) 150 MG tablet Take 150 mg by mouth as needed.      No current facility-administered medications for this visit.     OBJECTIVE: Vitals:   04/04/19 1106  BP: 111/64  Pulse: 88  Temp: 99.1 F (37.3 C)     Body mass index is 28.71 kg/m.    ECOG FS:0 - Asymptomatic  General: Well-developed, well-nourished, no acute distress. Eyes: Pink conjunctiva, anicteric sclera. HEENT: Normocephalic, moist mucous membranes. Breast: Patient declined breast exam today. Lungs: Clear to auscultation bilaterally. Heart: Regular rate and rhythm. No rubs, murmurs, or gallops. Abdomen: Soft, nontender, nondistended. No organomegaly noted, normoactive bowel sounds. Musculoskeletal: No edema, cyanosis, or clubbing. Neuro: Alert, answering all questions appropriately. Cranial nerves grossly intact. Skin: No rashes or petechiae noted. Psych: Normal affect.  LAB RESULTS:  Lab Results  Component Value Date   NA 141 03/18/2016   K 4.0 03/18/2016   CL 107 03/18/2016   CO2 24 03/18/2016   GLUCOSE 103 (H) 03/18/2016   BUN 21 (H) 03/18/2016   CREATININE 0.72 03/18/2016   CALCIUM 9.5 03/18/2016   GFRNONAA >60 03/18/2016   GFRAA >60 03/18/2016    Lab Results  Component Value Date   HGB 13.7 03/18/2016     STUDIES: Dg Bone Density  Result Date: 04/01/2019 EXAM: DUAL X-RAY ABSORPTIOMETRY (DXA) FOR BONE MINERAL DENSITY IMPRESSION: Technologist:VLM Your patient Karen Phillips completed a BMD test on 04/01/2019 using the [place] (analysis version: 14.10) manufactured by [employer]. The following summarizes the results of our evaluation. PATIENT BIOGRAPHICAL: Name: Karen, Phillips Patient ID: [id number] Birth Date: Mar 05, 1951 Height: 65.0 in. Gender: Female Exam Date: 04/01/2019 Weight: 183.0  lbs. Indications: Rheumatoid Arthritis, History of Breast Cancer, History of Radiation, Caucasian, History of Fracture (Adult), Tobacco User(Current Smoker), Postmenopausal Fractures: Right forearm Treatments: Anastrozole, Boniva, Calcium, Multi-Vitamin, Vitamin D ASSESSMENT: The BMD measured at AP Spine L1-L4 is 0.954 g/cm2 with a T-score of -1.9. This patient is considered OSTEOPENIC according to [facility]) criteria. Patient is not a candidate for FRAX due to [facility] treatment. The scan quality is good. Site Region Measured Measured WHO Young Adult BMD Date       Age      Classification T-score AP Spine L1-L4 04/01/2019 65.9 Osteopenia -1.9 0.954 g/cm2 AP Spine L1-L4 03/27/2018 64.9 Osteoporosis -2.5 0.880 g/cm2 AP Spine L1-L4 05/02/2017 64.0 Osteopenia -2.2 0.924 g/cm2 AP Spine L1-L4 10/02/2012 59.4 Osteopenia -2.3 0.905 g/cm2 DualFemur Neck Left 04/01/2019 65.9 Osteopenia -1.5 0.830 g/cm2 DualFemur Neck Left 03/27/2018 64.9 Osteopenia -1.6 0.810 g/cm2 DualFemur Neck Left 05/02/2017 64.0 Osteopenia -1.9 0.773 g/cm2 DualFemur Neck Left 08/27/2014 61.3 Osteopenia -1.8 0.784 g/cm2 DualFemur Neck Left 10/02/2012 59.4 Osteopenia -1.5 0.823 g/cm2  DualFemur Total Mean 04/01/2019 65.9 Osteopenia -1.1 0.868 g/cm2 DualFemur Total Mean 03/27/2018 64.9 Normal -0.9 0.889 g/cm2 DualFemur Total Mean 05/02/2017 64.0 Normal -1.0 0.878 g/cm2 DualFemur Total Mean 08/27/2014 61.3 Osteopenia -1.1 0.865 g/cm2 DualFemur Total Mean 10/02/2012 59.4 Normal -0.9 0.893 g/cm2 World Health Organization [facility]) criteria for post-menopausal, Caucasian Women: Normal:       T-score at or above -1 SD Osteopenia:   T-score between -1 and -2.5 SD Osteoporosis: T-score at or below -2.5 SD RECOMMENDATIONS: 1. All patients should optimize calcium and vitamin D intake. 2. Consider FDA-approved medical therapies in postmenopausal women and men aged 56 years and older, based on the following: a. A hip or vertebral(clinical or  morphometric) fracture b. T-score < -2.5 at the femoral neck or spine after appropriate evaluation to exclude secondary causes c. Low bone mass (T-score between -1.0 and -2.5 at the femoral neck or spine) and a 10-year probability of a hip fracture > 3% or a 10-year probability of a major osteoporosis-related fracture > 20% based on the [place]-adapted WHO algorithm d. Clinician judgment and/or patient preferences may indicate treatment for people with 10-year fracture probabilities above or below these levels FOLLOW-UP: People with diagnosed cases of osteoporosis or at high risk for fracture should have regular bone mineral density tests. For patients eligible for Medicare, routine testing is allowed once every 2 years. The testing frequency can be increased to one year for patients who have rapidly progressing disease, those who are receiving or discontinuing medical therapy to restore bone mass, or have additional risk factors. I have reviewed this report, and agree with the above findings. Mark A. Thornton Papas, M.D. [facility] Radiology Electronically Signed   By: Lavonia Dana M.D.   On: 04/01/2019 14:30   Mm Diag Breast Tomo Bilateral  Result Date: 04/01/2019 CLINICAL DATA:  LEFT lumpectomy with radiation therapy 2017. EXAM: DIGITAL DIAGNOSTIC BILATERAL MAMMOGRAM WITH CAD AND TOMO COMPARISON:  03/27/2018 and earlier ACR Breast Density Category b: There are scattered areas of fibroglandular density. FINDINGS: Post operative changes are seen in the LEFTbreast. No suspicious mass, distortion, or microcalcifications are identified to suggest presence of malignancy. Mammographic images were processed with CAD. IMPRESSION: No mammographic evidence for malignancy. RECOMMENDATION: Diagnostic mammogram is suggested in 1 year. (Code:[id number]-B-01Y) I have discussed the findings and recommendations with the patient. If applicable, a reminder letter will be sent to the patient regarding the next appointment. BI-RADS CATEGORY  2: Benign.  Electronically Signed   By: Nolon Nations M.D.   On: 04/01/2019 13:55    ASSESSMENT: Pathologic stage IA ER/PR positive, HER-2 negative invasive carcinoma of the upper outer quadrant of the left breast. Low risk MammoPrint.  PLAN:    1. Pathologic stage IA ER/PR positive, HER-2 negative invasive carcinoma of the upper outer quadrant of the left breast: Patient had her lumpectomy March 22, 2016.  She also was found to be a low risk MammoPrint, therefore did not require adjuvant chemotherapy. Patient competed MammoSite XRT.  Patient discontinued letrozole secondary to hair loss, but now is tolerating anastrozole well without significant side effects.  Continue anastrozole for a total of 5 years completing in November 2022.  Her most recent mammogram on April 01, 2019 was reported as BI-RADS 2.  Repeat in October 2021.  Return to clinic in 6 months for evaluation via video assisted telemedicine visit.   2.  Osteopenia: Patient's most recent bone mineral density on April 01 2019 was significantly improved with a reported T score of -1.9.  1  year prior her T score was reported -2.5.  Continue monthly Boniva as well as calcium and vitamin D supplementation.  Repeat in October 2021.    Patient expressed understanding and was in agreement with this plan. She also understands that She can call clinic at any time with any questions, concerns, or complaints.   Cancer Staging Malignant neoplasm of female breast [facility]) Staging form: Breast, AJCC 7th Edition - Pathologic stage from 04/04/2016: Stage IA (T1b, N0, cM0) - Signed by Lloyd Huger, MD on 04/04/2016   Lloyd Huger, MD 04/05/19 8:59 AM

## 2019-04-01 ENCOUNTER — Ambulatory Visit
Admission: RE | Admit: 2019-04-01 | Discharge: 2019-04-01 | Disposition: A | Discharge status: Home / Self Care | Payer: Medicare Other | Source: Ambulatory Visit | Attending: Oncology | Admitting: Oncology

## 2019-04-01 DIAGNOSIS — Z170 Estrogen receptor positive status [ER+]: Secondary | ICD-10-CM | POA: Diagnosis not present

## 2019-04-01 DIAGNOSIS — Z780 Asymptomatic menopausal state: Secondary | ICD-10-CM | POA: Diagnosis not present

## 2019-04-01 DIAGNOSIS — C50412 Malignant neoplasm of upper-outer quadrant of left female breast: Secondary | ICD-10-CM

## 2019-04-01 DIAGNOSIS — M8589 Other specified disorders of bone density and structure, multiple sites: Secondary | ICD-10-CM | POA: Insufficient documentation

## 2019-04-03 ENCOUNTER — Other Ambulatory Visit: Payer: Self-pay

## 2019-04-03 NOTE — Progress Notes (Signed)
Patient pre screened for office appointment, no questions or concerns today. 

## 2019-04-04 ENCOUNTER — Ambulatory Visit
Admission: RE | Admit: 2019-04-04 | Discharge: 2019-04-04 | Disposition: A | Discharge status: Home / Self Care | Payer: Medicare Other | Source: Ambulatory Visit | Attending: Radiation Oncology | Admitting: Radiation Oncology

## 2019-04-04 ENCOUNTER — Other Ambulatory Visit: Payer: Self-pay

## 2019-04-04 ENCOUNTER — Encounter (INDEPENDENT_AMBULATORY_CARE_PROVIDER_SITE_OTHER): Payer: Self-pay

## 2019-04-04 ENCOUNTER — Inpatient Hospital Stay: Payer: Medicare Other | Attending: Oncology | Admitting: Oncology

## 2019-04-04 ENCOUNTER — Encounter: Payer: Self-pay | Admitting: Radiation Oncology

## 2019-04-04 VITALS — BP 111/64 | HR 88 | Temp 99.1°F | Wt 183.3 lb

## 2019-04-04 VITALS — BP 128/74 | HR 97 | Temp 99.1°F | Resp 18 | Wt 183.4 lb

## 2019-04-04 DIAGNOSIS — M8580 Other specified disorders of bone density and structure, unspecified site: Secondary | ICD-10-CM | POA: Insufficient documentation

## 2019-04-04 DIAGNOSIS — Z170 Estrogen receptor positive status [ER+]: Secondary | ICD-10-CM | POA: Insufficient documentation

## 2019-04-04 DIAGNOSIS — Z79811 Long term (current) use of aromatase inhibitors: Secondary | ICD-10-CM | POA: Insufficient documentation

## 2019-04-04 DIAGNOSIS — C50412 Malignant neoplasm of upper-outer quadrant of left female breast: Secondary | ICD-10-CM

## 2019-04-04 DIAGNOSIS — Z923 Personal history of irradiation: Secondary | ICD-10-CM | POA: Diagnosis not present

## 2019-04-04 NOTE — Progress Notes (Signed)
Radiation Oncology Follow up Note  Name: Karen Phillips   Date:   04/04/2019 MRN:  [id number] DOB: Nov 01, 1950    This 68 y.o. female presents to the clinic today for 3-year follow-up status post accelerated partial breast radiation to her left breast for stage I ER/PR positive invasive mammary carcinoma.  REFERRING PROVIDER: Peggyann Juba, NP  HPI: Patient is a 68 year old female now out 3 years having completed accelerated partial breast radiation to her left breast for stage I ER/PR positive invasive mammary carcinoma.  Seen today in routine follow-up she is doing well.  She specifically denies breast tenderness cough or bone pain.  She is currently on Arimidex tolerating it well without side effect..  She recently had a mammogram which was BI-RADS 2 benign which I have reviewed.  COMPLICATIONS OF TREATMENT: none  FOLLOW UP COMPLIANCE: keeps appointments   PHYSICAL EXAM:  BP 128/74   Pulse 97   Temp 99.1 F (37.3 C)   Resp 18   Wt 183 lb 6.4 oz (83.2 kg)   BMI 28.72 kg/m  Lungs are clear to A&P cardiac examination essentially unremarkable with regular rate and rhythm. No dominant mass or nodularity is noted in either breast in 2 positions examined. Incision is well-healed. No axillary or supraclavicular adenopathy is appreciated. Cosmetic result is excellent.  Well-developed well-nourished patient in NAD. HEENT reveals PERLA, EOMI, discs not visualized.  Oral cavity is clear. No oral mucosal lesions are identified. Neck is clear without evidence of cervical or supraclavicular adenopathy. Lungs are clear to A&P. Cardiac examination is essentially unremarkable with regular rate and rhythm without murmur rub or thrill. Abdomen is benign with no organomegaly or masses noted. Motor sensory and DTR levels are equal and symmetric in the upper and lower extremities. Cranial nerves II through XII are grossly intact. Proprioception is intact. No peripheral adenopathy or edema is identified. No  motor or sensory levels are noted. Crude visual fields are within normal range.  RADIOLOGY RESULTS: Mammograms reviewed compatible with above-stated findings  PLAN: Present time patient is doing well now 3 years out from accelerated partial breast irradiation with no evidence of disease.  I am pleased with her overall progress.  I have asked to see her back in 1 year for follow-up.  Patient knows to call sooner with any concerns.  Patient continues Arimidex without side effect.  Follow-up mammograms have already been ordered.  I would like to take this opportunity to thank you for allowing me to participate in the care of your patient.Noreene Filbert, MD

## 2019-04-10 ENCOUNTER — Encounter: Payer: Self-pay | Admitting: Surgery

## 2019-04-10 ENCOUNTER — Other Ambulatory Visit: Payer: Self-pay

## 2019-04-10 ENCOUNTER — Ambulatory Visit (INDEPENDENT_AMBULATORY_CARE_PROVIDER_SITE_OTHER): Payer: Medicare Other | Admitting: Surgery

## 2019-04-10 VITALS — BP 136/78 | HR 93 | Temp 97.7°F | Ht 65.0 in | Wt 183.8 lb

## 2019-04-10 DIAGNOSIS — C50412 Malignant neoplasm of upper-outer quadrant of left female breast: Secondary | ICD-10-CM | POA: Diagnosis not present

## 2019-04-10 NOTE — Progress Notes (Signed)
Outpatient Surgical Follow Up  04/10/2019  Karen Phillips is an 68 y.o. female.   Chief Complaint  Patient presents with  . Follow-up    Mammo f/u 04/01/19     HPI: 68 year old female well-known to our service with a prior history of left breast cancer ER/PR positive HER-2 negative status post lumpectomy and radiation therapy.  She is doing well without any concerns.  No fevers no chills.  No weight loss.  No new breast masses or concerns.  She did have a mammogram that I have personally reviewed showing no evidence of new concerning lesions.  She is taking hormonal replacement therapy and she has tolerated this well.  Past Medical History:  Diagnosis Date  . Arthritis   . Breast cancer ([place]) 02/15/2016   pT1b, N0; ER 90%, PR 90%, HER-2/neu not overexpressed. Closest margin 1 mm.Mammoprint: Low riisk.  . Cataract   . Hemorrhoids   . Hyperlipidemia   . Hypertension   . Osteoporosis   . Personal history of radiation therapy 2017   MammoSite XRT    Past Surgical History:  Procedure Laterality Date  . BREAST BIOPSY Left 02/15/2016   INVASIVE MAMMARY CARCINOMA  . BREAST LUMPECTOMY WITH SENTINEL LYMPH NODE BIOPSY Left 03/22/2016   Procedure: BREAST LUMPECTOMY WITH SENTINEL LYMPH NODE BX;  Surgeon: Robert Bellow, MD;  Location: [facility] ORS;  Service: General;  Laterality: Left;  . COLONOSCOPY  2016    Family History  Problem Relation Age of Onset  . Colon cancer Mother   . Breast cancer Maternal Aunt   . Prostate cancer Father     Social History:  reports that she has been smoking cigarettes. She has a 40.00 pack-year smoking history. She has never used smokeless tobacco. She reports current alcohol use of about 2.0 standard drinks of alcohol per week. She reports that she does not use drugs.  Allergies:  Allergies  Allergen Reactions  . Ibuprofen Swelling  . Sulfamethizole Rash    Other reaction(s): UNKNOWN    Medications reviewed.    ROS Full ROS performed and  is otherwise negative other than what is stated in HPI   BP 136/78   Pulse 93   Temp 97.7 F (36.5 C) (Temporal)   Ht _0  (1.651 m)   Wt 183 lb 12.8 oz (83.4 kg)   SpO2 98%   BMI 30.59 kg/m   Physical Exam Vitals signs and nursing note reviewed. Exam conducted with a chaperone present.  Constitutional:      General: She is not in acute distress.    Appearance: Normal appearance. She is normal weight. She is not ill-appearing.  Eyes:     General: No scleral icterus.       Right eye: No discharge.        Left eye: No discharge.  Neck:     Musculoskeletal: Normal range of motion. No neck rigidity or muscular tenderness.     Vascular: No carotid bruit.  Cardiovascular:     Rate and Rhythm: Normal rate.     Heart sounds: No murmur.  Pulmonary:     Effort: Pulmonary effort is normal. No respiratory distress.     Breath sounds: No wheezing.     Comments: BREAST: LEFT LUMPECTOMY, NO MASSES, NO LAD. Abdominal:     General: Abdomen is flat. There is no distension.     Palpations: There is no mass.     Tenderness: There is no abdominal tenderness. There is no guarding or rebound.  Hernia: No hernia is present.  Musculoskeletal: Normal range of motion.        General: No swelling or tenderness.  Skin:    General: Skin is warm and dry.     Capillary Refill: Capillary refill takes less than 2 seconds.     Coloration: Skin is not jaundiced.  Neurological:     General: No focal deficit present.     Mental Status: She is alert and oriented to person, place, and time.  Psychiatric:        Mood and Affect: Mood normal.        Behavior: Behavior normal.        Thought Content: Thought content normal.        Judgment: Judgment normal.         Assessment/Plan:  1. Malignant neoplasm of upper-outer quadrant of left female breast, unspecified estrogen receptor status ([place]) She is doing well without evidence of recurrence.  She sees Dr. Grayland Ormond wishes to continue her yearly  follow-up with him.  We will see her on a as needed basis    Greater than 50% of the 25 minutes  visit was spent in counseling/coordination of care   Caroleen Hamman, MD [place] Surgeon

## 2019-04-10 NOTE — Patient Instructions (Signed)
  Follow up with Dr Grayland Ormond next year for your mammogram.   Continue self breast exams. Call office for any new breast issues or concerns.

## 2019-04-23 ENCOUNTER — Ambulatory Visit: Payer: Medicare Other | Admitting: Radiation Oncology

## 2019-05-12 ENCOUNTER — Other Ambulatory Visit: Payer: Self-pay | Admitting: Oncology

## 2019-05-12 DIAGNOSIS — M8580 Other specified disorders of bone density and structure, unspecified site: Secondary | ICD-10-CM

## 2019-05-12 DIAGNOSIS — Z170 Estrogen receptor positive status [ER+]: Secondary | ICD-10-CM

## 2019-05-12 DIAGNOSIS — C50412 Malignant neoplasm of upper-outer quadrant of left female breast: Secondary | ICD-10-CM

## 2019-07-26 IMAGING — CR DG HIP (WITH OR WITHOUT PELVIS) 2-3V*L*
3 series · 3 of 3 positions shown · non-contrast
Comparison: None.

CLINICAL DATA: Chronic left lateral hip pain.

EXAM:
DG HIP (WITH OR WITHOUT PELVIS) 2-3V LEFT

[pelvis ap]
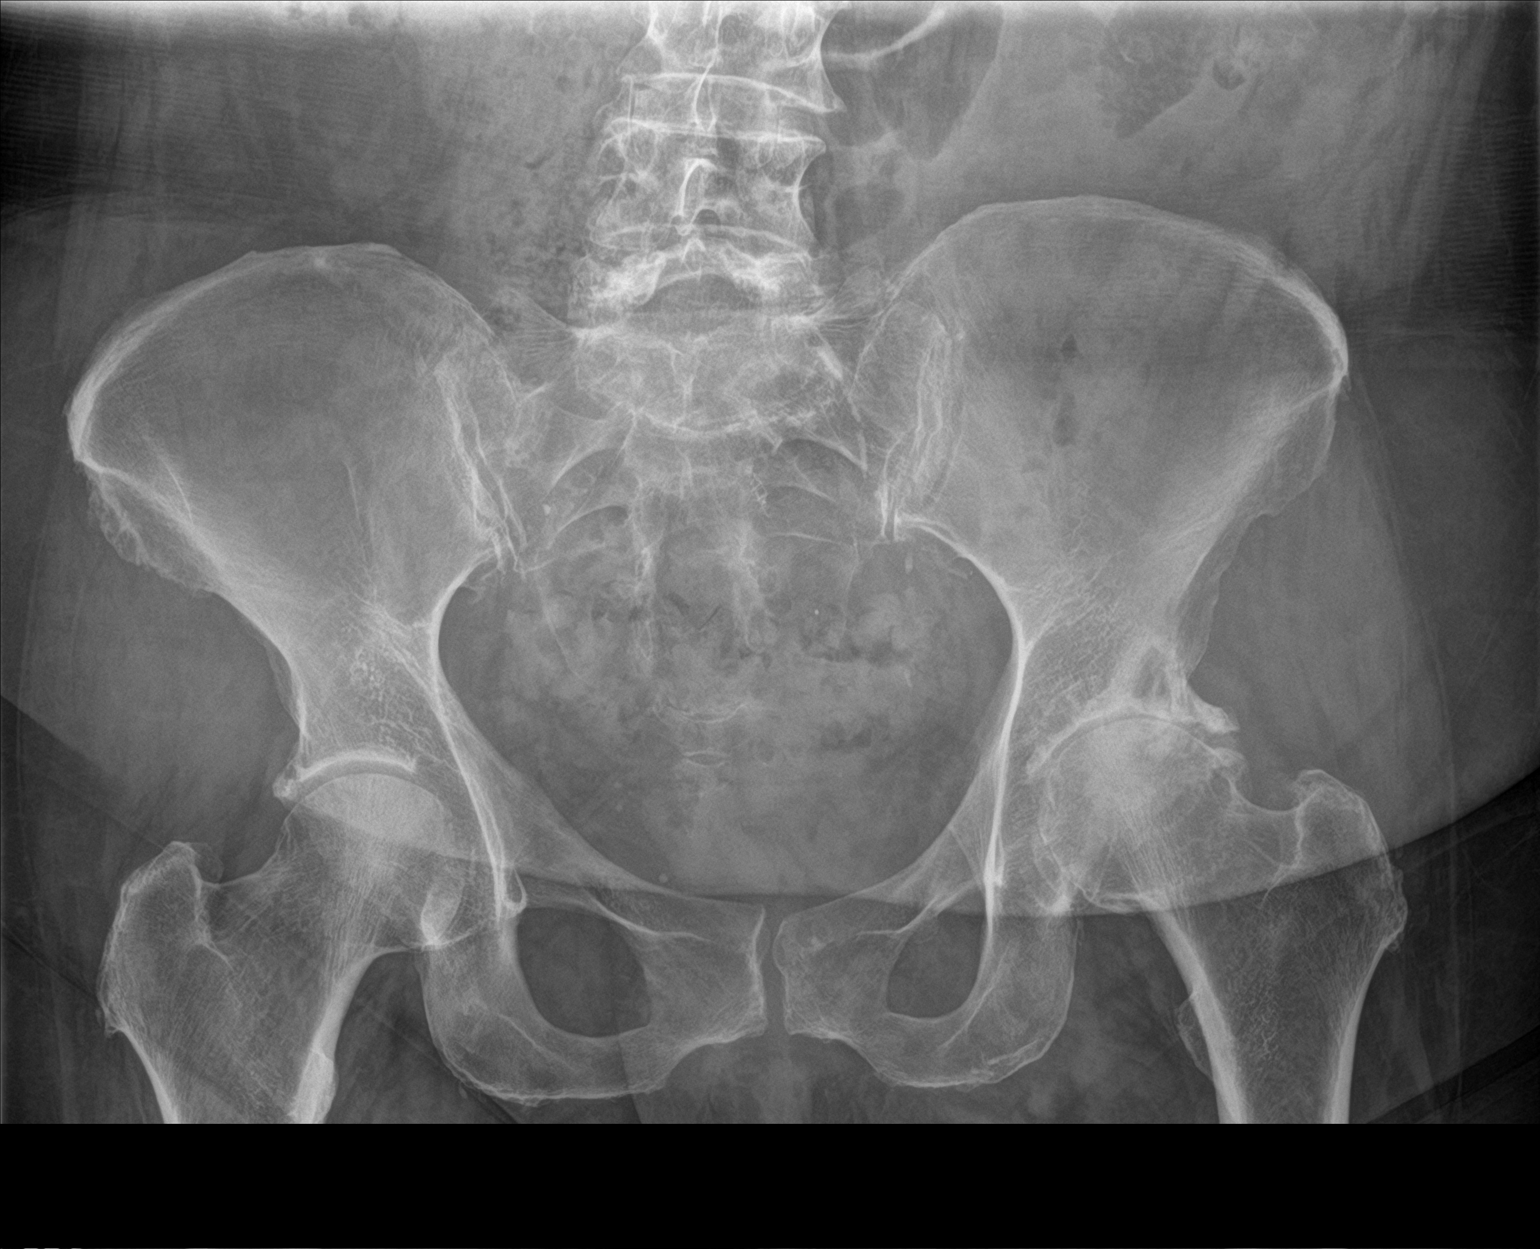

[hip ap]
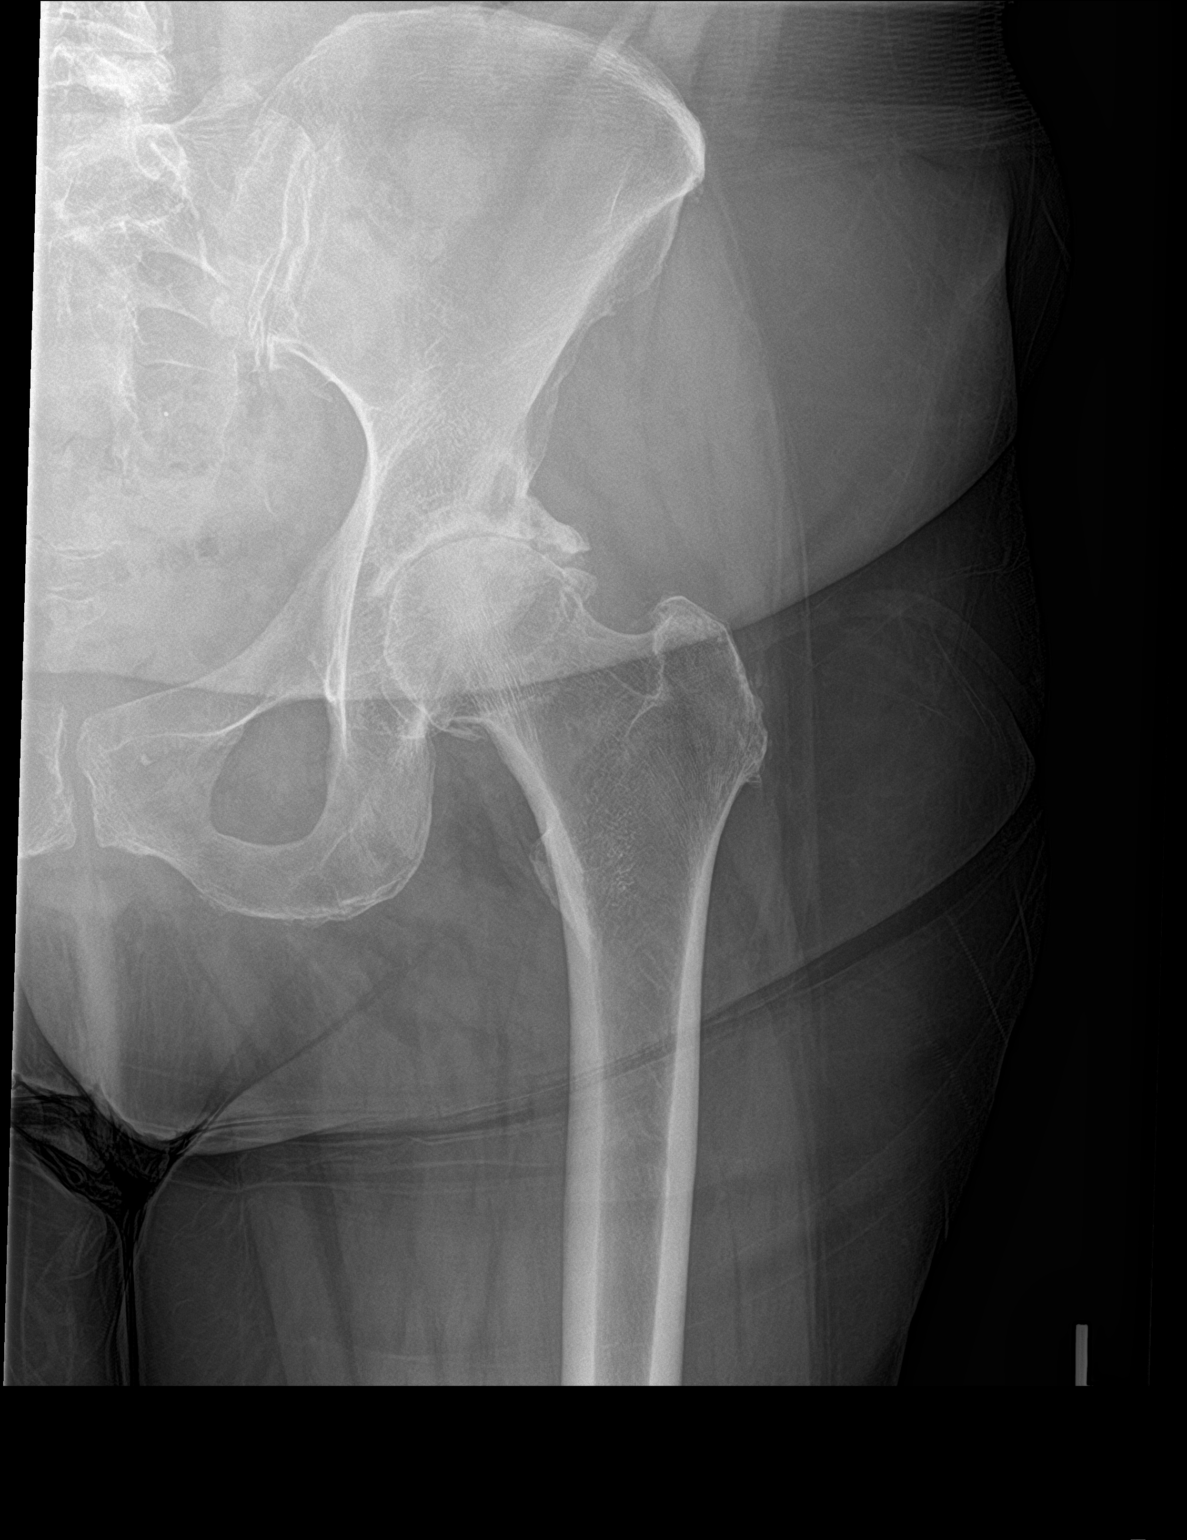

[hip lat]
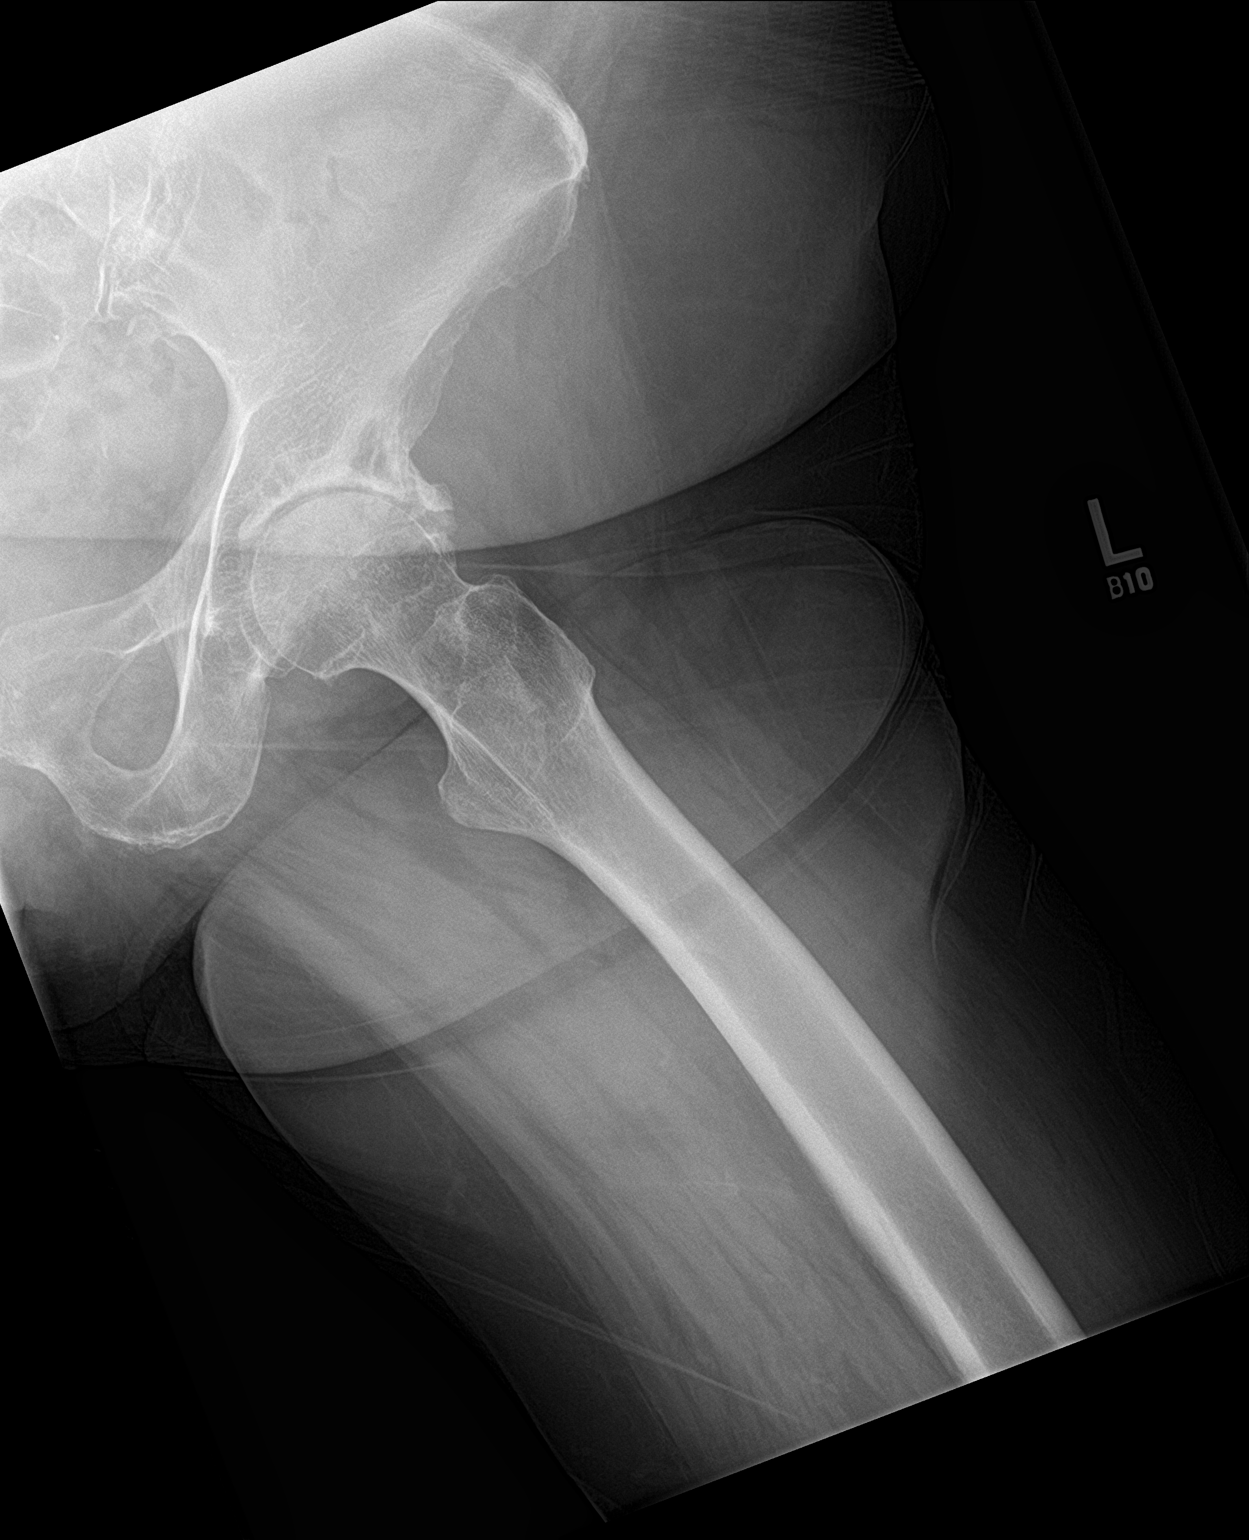

[3 of 3 positions shown; findings below may reference images not displayed]

FINDINGS: Advanced degenerative changes are present in the left hip. Prominent
osteophytes are noted. There is some sclerosis in the left femoral
head without flattening. The hip is located. Pelvis is intact.
Degenerative changes are noted in the lower lumbar spine and SI
joints bilaterally.
IMPRESSION: 1. Advanced degenerative changes of the left hip significant loss of
joint space and large osteophytes.
2. Sclerosis of the left femoral head without collapse.

## 2019-09-16 DIAGNOSIS — Z79899 Other long term (current) drug therapy: Secondary | ICD-10-CM | POA: Diagnosis not present

## 2019-09-16 DIAGNOSIS — M150 Primary generalized (osteo)arthritis: Secondary | ICD-10-CM | POA: Diagnosis not present

## 2019-09-16 DIAGNOSIS — E663 Overweight: Secondary | ICD-10-CM | POA: Diagnosis not present

## 2019-09-16 DIAGNOSIS — M064 Inflammatory polyarthropathy: Secondary | ICD-10-CM | POA: Diagnosis not present

## 2019-09-16 DIAGNOSIS — M2550 Pain in unspecified joint: Secondary | ICD-10-CM | POA: Diagnosis not present

## 2019-09-19 ENCOUNTER — Other Ambulatory Visit: Payer: Self-pay | Admitting: Oncology

## 2019-09-26 DIAGNOSIS — E7800 Pure hypercholesterolemia, unspecified: Secondary | ICD-10-CM | POA: Diagnosis not present

## 2019-09-26 DIAGNOSIS — Z170 Estrogen receptor positive status [ER+]: Secondary | ICD-10-CM | POA: Diagnosis not present

## 2019-09-26 DIAGNOSIS — I10 Essential (primary) hypertension: Secondary | ICD-10-CM | POA: Diagnosis not present

## 2019-09-26 DIAGNOSIS — M06041 Rheumatoid arthritis without rheumatoid factor, right hand: Secondary | ICD-10-CM | POA: Diagnosis not present

## 2019-09-26 DIAGNOSIS — Z23 Encounter for immunization: Secondary | ICD-10-CM | POA: Diagnosis not present

## 2019-09-26 DIAGNOSIS — C50912 Malignant neoplasm of unspecified site of left female breast: Secondary | ICD-10-CM | POA: Diagnosis not present

## 2019-09-26 DIAGNOSIS — M06042 Rheumatoid arthritis without rheumatoid factor, left hand: Secondary | ICD-10-CM | POA: Diagnosis not present

## 2019-09-26 DIAGNOSIS — K219 Gastro-esophageal reflux disease without esophagitis: Secondary | ICD-10-CM | POA: Diagnosis not present

## 2019-09-26 DIAGNOSIS — B354 Tinea corporis: Secondary | ICD-10-CM | POA: Diagnosis not present

## 2019-09-26 DIAGNOSIS — M8580 Other specified disorders of bone density and structure, unspecified site: Secondary | ICD-10-CM | POA: Diagnosis not present

## 2019-09-28 NOTE — Progress Notes (Signed)
[place]  Telephone:([phone] Fax:(336) [phone]  [name] OB: [birth year]  MR#: [id number]  ZPH#:[id number]  Patient Care Team: Peggyann Juba, NP as PCP - General Peggyann Juba, NP as Nurse Practitioner Bary Castilla Forest Gleason, MD (General Surgery)   CHIEF COMPLAINT: Pathologic stage IA ER/PR positive, HER-2 negative invasive carcinoma of the upper outer quadrant of the left breast.  INTERVAL HISTORY: Patient returns to clinic today for routine 38-monthevaluation.  She continues to feel well and remains asymptomatic.  She is tolerating anastrozole without significant side effects.  She has no neurologic complaints.  She denies any recent fevers or illnesses.  She has a good appetite and denies weight loss.  She denies any chest pain, shortness of breath, cough, or hemoptysis.  She denies any nausea, vomiting, constipation, or diarrhea. She has no urinary complaints.  Patient feels at her baseline offers no specific complaints today.  REVIEW OF SYSTEMS:   Review of Systems  Constitutional: Negative.  Negative for fever, malaise/fatigue and weight loss.  Respiratory: Negative.  Negative for cough and shortness of breath.   Cardiovascular: Negative.  Negative for chest pain and leg swelling.  Gastrointestinal: Negative.  Negative for abdominal pain and constipation.  Genitourinary: Negative.  Negative for hematuria.  Musculoskeletal: Negative.  Negative for joint pain.  Skin: Negative.  Negative for rash.  Neurological: Negative.  Negative for sensory change, focal weakness and weakness.  Psychiatric/Behavioral: Negative.  The patient is not nervous/anxious and does not have insomnia.    As per HPI. Otherwise, a complete review of systems is negative.   PAST MEDICAL HISTORY: Past Medical History:  Diagnosis Date  . Arthritis   . [place] 02[place]15[place]   pT1b, N0; ER 90%, PR 90%, HER-2/neu not overexpressed. Closest margin 1 mm.Mammoprint:  Low riisk.  . Cataract   . Hemorrhoids   . Hyperlipidemia   . Hypertension   . Osteoporosis   . Personal history of radiation therapy 2017   MammoSite XRT    PAST SURGICAL HISTORY: Past Surgical History:  Procedure Laterality Date  . BREAST BIOPSY Left 02/15/2016   INVASIVE MAMMARY CARCINOMA  . BREAST LUMPECTOMY WITH SENTINEL LYMPH NODE BIOPSY Left 03/22/2016   Procedure: BREAST LUMPECTOMY WITH SENTINEL LYMPH NODE BX;  Surgeon: JRobert Bellow MD;  Location: [facility] ORS;  Service: General;  Laterality: Left;  . COLONOSCOPY  2016    FAMILY HISTORY: Family History  Problem Relation Age of Onset  . Colon cancer Mother   . Breast cancer Maternal Aunt   . Prostate cancer Father     ADVANCED DIRECTIVES (Y/N):  N  HEALTH MAINTENANCE: Social History   Tobacco Use  . Smoking status: Current Every Day Smoker    Packs/day: 1.00    Years: 40.00    Pack years: 40.00    Types: Cigarettes  . Smokeless tobacco: Never Used  Substance Use Topics  . Alcohol use: Yes    Alcohol/week: 2.0 standard drinks    Types: 2 Glasses of wine per week  . Drug use: No     Colonoscopy:  PAP:  Bone density:  Lipid panel:  Allergies  Allergen Reactions  . Ibuprofen Swelling  . Sulfamethizole Rash    Other reaction(s): UNKNOWN    Current Outpatient Medications  Medication Sig Dispense Refill  . anastrozole (ARIMIDEX) 1 MG tablet TAKE 1 TABLET DAILY 90 tablet 3  . aspirin EC 81 MG tablet Take 81 mg by mouth daily.    .Marland Kitchen  atorvastatin (LIPITOR) 20 MG tablet Take 20 mg by mouth daily at 6 PM.     . calcium carbonate (OSCAL) 1500 (600 Ca) MG TABS tablet Take 1,500 mg by mouth daily with breakfast.    . hydroxychloroquine (PLAQUENIL) 200 MG tablet Take 400 mg by mouth daily.     Marland Kitchen ibandronate (BONIVA) 150 MG tablet TAKE AS INSTRUCTED BY YOUR PRESCRIBER 12 tablet 3  . losartan-hydrochlorothiazide (HYZAAR) 50-12.5 MG tablet Take 1 tablet by mouth daily.     . Multiple Vitamin (MULTIVITAMIN)  tablet Take 1 tablet by mouth daily.    . naproxen (NAPROSYN) 500 MG tablet Take 500 mg by mouth in the morning and at bedtime.    Marland Kitchen omeprazole (PRILOSEC) 20 MG capsule Take 20 mg by mouth daily.      No current facility-administered medications for this visit.    OBJECTIVE: Vitals:   10/03/19 1105  BP: 126/72  Pulse: 98  Resp: 20  Temp: 98.1 F (36.7 C)  SpO2: 97%     Body mass index is 30.35 kg/m.    ECOG FS:0 - Asymptomatic  General: Well-developed, well-nourished, no acute distress. Eyes: Pink conjunctiva, anicteric sclera. HEENT: Normocephalic, moist mucous membranes. Breast: Exam deferred today. Lungs: No audible wheezing or coughing. Heart: Regular rate and rhythm. Abdomen: Soft, nontender, no obvious distention. Musculoskeletal: No edema, cyanosis, or clubbing. Neuro: Alert, answering all questions appropriately. Cranial nerves grossly intact. Skin: No rashes or petechiae noted. Psych: Normal affect.   LAB RESULTS:  Lab Results  Component Value Date   NA 141 03/18/2016   K 4.0 03/18/2016   CL 107 03/18/2016   CO2 24 03/18/2016   GLUCOSE 103 (H) 03/18/2016   BUN 21 (H) 03/18/2016   CREATININE 0.72 03/18/2016   CALCIUM 9.5 03/18/2016   GFRNONAA >60 03/18/2016   GFRAA >60 03/18/2016    Lab Results  Component Value Date   HGB 13.7 03/18/2016     STUDIES: No results found.  ASSESSMENT: Pathologic stage IA ER/PR positive, HER-2 negative invasive carcinoma of the upper outer quadrant of the left breast. Low risk MammoPrint.  PLAN:    1. Pathologic stage IA ER/PR positive, HER-2 negative invasive carcinoma of the upper outer quadrant of the left breast: Patient underwent lumpectomy on March 22, 2016.  MammoPrint was reported as low risk, therefore did not require adjuvant chemotherapy. Patient competed MammoSite XRT.  Patient discontinued letrozole secondary to hair loss, but now is tolerating anastrozole well without significant side effects.  Continue  anastrozole for a total of 5 years completing treatment in November 2022.  Her most recent mammogram on April 01, 2019 was reported as BI-RADS 2.  Repeat in October 2021.  Return to clinic in 6 months for routine evaluation. 2.  Osteopenia: Patient's most recent bone mineral density on April 01, 2019 was significantly improved with a reported T score of -1.9.  One year prior her T score was reported -2.5.  Continue monthly Boniva as well as calcium and vitamin D supplementation.  Repeat in October 2021.  If there is continued improvement of her bone density, can consider discontinuing Boniva.  Patient expressed understanding and was in agreement with this plan. She also understands that She can call clinic at any time with any questions, concerns, or complaints.   Cancer Staging Malignant neoplasm of female breast [facility]) Staging form: Breast, AJCC 7th Edition - Pathologic stage from 04/04/2016: Stage IA (T1b, N0, cM0) - Signed by Lloyd Huger, MD on 04/04/2016  Lloyd Huger, MD 10/03/19 6:40 PM

## 2019-10-02 ENCOUNTER — Encounter: Payer: Self-pay | Admitting: Oncology

## 2019-10-02 NOTE — Progress Notes (Signed)
Patient called for pre assessment. Denies any pain or concerns at this time.  °

## 2019-10-03 ENCOUNTER — Inpatient Hospital Stay: Payer: Medicare Other | Attending: Oncology | Admitting: Oncology

## 2019-10-03 ENCOUNTER — Other Ambulatory Visit: Payer: Self-pay

## 2019-10-03 ENCOUNTER — Encounter: Payer: Self-pay | Admitting: Oncology

## 2019-10-03 VITALS — BP 126/72 | HR 98 | Temp 98.1°F | Resp 20 | Wt 182.4 lb

## 2019-10-03 DIAGNOSIS — Z79811 Long term (current) use of aromatase inhibitors: Secondary | ICD-10-CM

## 2019-10-03 DIAGNOSIS — C50412 Malignant neoplasm of upper-outer quadrant of left female breast: Secondary | ICD-10-CM | POA: Diagnosis not present

## 2019-10-03 DIAGNOSIS — M8580 Other specified disorders of bone density and structure, unspecified site: Secondary | ICD-10-CM | POA: Insufficient documentation

## 2019-10-03 DIAGNOSIS — Z170 Estrogen receptor positive status [ER+]: Secondary | ICD-10-CM | POA: Diagnosis not present

## 2019-10-22 DIAGNOSIS — R7989 Other specified abnormal findings of blood chemistry: Secondary | ICD-10-CM | POA: Diagnosis not present

## 2019-12-05 DIAGNOSIS — Z0000 Encounter for general adult medical examination without abnormal findings: Secondary | ICD-10-CM | POA: Diagnosis not present

## 2019-12-05 DIAGNOSIS — Z7189 Other specified counseling: Secondary | ICD-10-CM | POA: Diagnosis not present

## 2020-03-13 DIAGNOSIS — M069 Rheumatoid arthritis, unspecified: Secondary | ICD-10-CM | POA: Diagnosis not present

## 2020-03-13 DIAGNOSIS — H2513 Age-related nuclear cataract, bilateral: Secondary | ICD-10-CM | POA: Diagnosis not present

## 2020-03-13 DIAGNOSIS — Z79899 Other long term (current) drug therapy: Secondary | ICD-10-CM | POA: Diagnosis not present

## 2020-03-17 DIAGNOSIS — E663 Overweight: Secondary | ICD-10-CM | POA: Diagnosis not present

## 2020-03-17 DIAGNOSIS — Z79899 Other long term (current) drug therapy: Secondary | ICD-10-CM | POA: Diagnosis not present

## 2020-03-17 DIAGNOSIS — Z6828 Body mass index (BMI) 28.0-28.9, adult: Secondary | ICD-10-CM | POA: Diagnosis not present

## 2020-03-17 DIAGNOSIS — M150 Primary generalized (osteo)arthritis: Secondary | ICD-10-CM | POA: Diagnosis not present

## 2020-03-17 DIAGNOSIS — M2550 Pain in unspecified joint: Secondary | ICD-10-CM | POA: Diagnosis not present

## 2020-03-17 DIAGNOSIS — M064 Inflammatory polyarthropathy: Secondary | ICD-10-CM | POA: Diagnosis not present

## 2020-04-01 ENCOUNTER — Ambulatory Visit
Admission: RE | Admit: 2020-04-01 | Discharge: 2020-04-01 | Disposition: A | Discharge status: Home / Self Care | Payer: Medicare Other | Source: Ambulatory Visit | Attending: Oncology | Admitting: Oncology

## 2020-04-01 ENCOUNTER — Other Ambulatory Visit: Payer: Self-pay

## 2020-04-01 DIAGNOSIS — Z79811 Long term (current) use of aromatase inhibitors: Secondary | ICD-10-CM | POA: Diagnosis not present

## 2020-04-01 DIAGNOSIS — C50412 Malignant neoplasm of upper-outer quadrant of left female breast: Secondary | ICD-10-CM | POA: Insufficient documentation

## 2020-04-01 DIAGNOSIS — R928 Other abnormal and inconclusive findings on diagnostic imaging of breast: Secondary | ICD-10-CM | POA: Diagnosis not present

## 2020-04-01 DIAGNOSIS — Z170 Estrogen receptor positive status [ER+]: Secondary | ICD-10-CM | POA: Insufficient documentation

## 2020-04-01 DIAGNOSIS — Z87828 Personal history of other (healed) physical injury and trauma: Secondary | ICD-10-CM | POA: Diagnosis not present

## 2020-04-01 DIAGNOSIS — Z853 Personal history of malignant neoplasm of breast: Secondary | ICD-10-CM | POA: Diagnosis not present

## 2020-04-01 DIAGNOSIS — M85852 Other specified disorders of bone density and structure, left thigh: Secondary | ICD-10-CM | POA: Diagnosis not present

## 2020-04-01 DIAGNOSIS — Z720 Tobacco use: Secondary | ICD-10-CM | POA: Diagnosis not present

## 2020-04-04 NOTE — Progress Notes (Signed)
[place]  [phone]) [phone] Fax:(336) [phone]  ID: ADISEN BENNION OB: 04-01-1951  MR#: [id number]  NTI#:[id number]  Patient Care Team: Peggyann Juba, NP as PCP - General Peggyann Juba, NP as Nurse Practitioner Bary Castilla Forest Gleason, MD (General Surgery)   CHIEF COMPLAINT: Pathologic stage IA ER/PR positive, HER-2 negative invasive carcinoma of the upper outer quadrant of the left breast.  INTERVAL HISTORY: Patient returns to clinic today for routine 53-monthevaluation.  She continues to feel well remains asymptomatic.  She is tolerating anastrozole and Boniva well without significant side effects. She has no neurologic complaints.  She denies any recent fevers or illnesses.  She has a good appetite and denies weight loss.  She denies any chest pain, shortness of breath, cough, or hemoptysis.  She denies any nausea, vomiting, constipation, or diarrhea. She has no urinary complaints.  Patient offers no specific complaints today.  REVIEW OF SYSTEMS:   Review of Systems  Constitutional: Negative.  Negative for fever, malaise/fatigue and weight loss.  Respiratory: Negative.  Negative for cough and shortness of breath.   Cardiovascular: Negative.  Negative for chest pain and leg swelling.  Gastrointestinal: Negative.  Negative for abdominal pain and constipation.  Genitourinary: Negative.  Negative for hematuria.  Musculoskeletal: Negative.  Negative for joint pain.  Skin: Negative.  Negative for rash.  Neurological: Negative.  Negative for sensory change, focal weakness and weakness.  Psychiatric/Behavioral: Negative.  The patient is not nervous/anxious and does not have insomnia.    As per HPI. Otherwise, a complete review of systems is negative.   PAST MEDICAL HISTORY: Past Medical History:  Diagnosis Date  . Arthritis   . [place]   pT1b, N0; ER 90%, PR 90%, HER-2/neu not overexpressed. Closest margin 1 mm.Mammoprint: Low riisk.   . Cataract   . Hemorrhoids   . Hyperlipidemia   . Hypertension   . Osteoporosis   . Personal history of radiation therapy 2017   MammoSite XRT    PAST SURGICAL HISTORY: Past Surgical History:  Procedure Laterality Date  . BREAST BIOPSY Left 02/15/2016   INVASIVE MAMMARY CARCINOMA  . BREAST LUMPECTOMY WITH SENTINEL LYMPH NODE BIOPSY Left 03/22/2016   Procedure: BREAST LUMPECTOMY WITH SENTINEL LYMPH NODE BX;  Surgeon: JRobert Bellow MD;  Location: [facility] ORS;  Service: General;  Laterality: Left;  . COLONOSCOPY  2016    FAMILY HISTORY: Family History  Problem Relation Age of Onset  . Colon cancer Mother   . Breast cancer Maternal Aunt   . Prostate cancer Father     ADVANCED DIRECTIVES (Y/N):  N  HEALTH MAINTENANCE: Social History   Tobacco Use  . Smoking status: Current Every Day Smoker    Packs/day: 1.00    Years: 40.00    Pack years: 40.00    Types: Cigarettes  . Smokeless tobacco: Never Used  Vaping Use  . Vaping Use: Never used  Substance Use Topics  . Alcohol use: Yes    Alcohol/week: 2.0 standard drinks    Types: 2 Glasses of wine per week  . Drug use: No     Colonoscopy:  PAP:  Bone density:  Lipid panel:  Allergies  Allergen Reactions  . Ibuprofen Swelling  . Sulfamethizole Rash    Other reaction(s): UNKNOWN    Current Outpatient Medications  Medication Sig Dispense Refill  . anastrozole (ARIMIDEX) 1 MG tablet TAKE 1 TABLET DAILY 90 tablet 3  . aspirin EC 81 MG tablet Take 81 mg by  mouth daily.    Marland Kitchen atorvastatin (LIPITOR) 20 MG tablet Take 20 mg by mouth daily at 6 PM.     . calcium carbonate (OSCAL) 1500 (600 Ca) MG TABS tablet Take 1,500 mg by mouth daily with breakfast.    . hydroxychloroquine (PLAQUENIL) 200 MG tablet Take 400 mg by mouth daily.     Marland Kitchen ibandronate (BONIVA) 150 MG tablet TAKE AS INSTRUCTED BY YOUR PRESCRIBER 12 tablet 3  . losartan-hydrochlorothiazide (HYZAAR) 50-12.5 MG tablet Take 1 tablet by mouth daily.     .  Multiple Vitamin (MULTIVITAMIN) tablet Take 1 tablet by mouth daily.    . naproxen (NAPROSYN) 500 MG tablet Take 500 mg by mouth in the morning and at bedtime.    Marland Kitchen omeprazole (PRILOSEC) 20 MG capsule Take 20 mg by mouth daily.      No current facility-administered medications for this visit.    OBJECTIVE: Vitals:   04/06/20 1107  BP: 128/74  Pulse: 87  Resp: 20  Temp: (!) 97.5 F (36.4 C)  SpO2: 99%     Body mass index is 29.87 kg/m.    ECOG FS:0 - Asymptomatic  General: Well-developed, well-nourished, no acute distress. Eyes: Pink conjunctiva, anicteric sclera. HEENT: Normocephalic, moist mucous membranes. Lungs: No audible wheezing or coughing. Heart: Regular rate and rhythm. Abdomen: Soft, nontender, no obvious distention. Musculoskeletal: No edema, cyanosis, or clubbing. Neuro: Alert, answering all questions appropriately. Cranial nerves grossly intact. Skin: No rashes or petechiae noted. Psych: Normal affect.   LAB RESULTS:  Lab Results  Component Value Date   NA 141 03/18/2016   K 4.0 03/18/2016   CL 107 03/18/2016   CO2 24 03/18/2016   GLUCOSE 103 (H) 03/18/2016   BUN 21 (H) 03/18/2016   CREATININE 0.72 03/18/2016   CALCIUM 9.5 03/18/2016   GFRNONAA >60 03/18/2016   GFRAA >60 03/18/2016    Lab Results  Component Value Date   HGB 13.7 03/18/2016     STUDIES: DG Bone Density  Result Date: 04/01/2020 EXAM: DUAL X-RAY ABSORPTIOMETRY (DXA) FOR BONE MINERAL DENSITY IMPRESSION: Your patient Temima Kutsch completed a BMD test on 04/01/2020 using the [place] (software version: 14.10) manufactured by [employer]. The following summarizes the results of our evaluation. Technologist:VLM PATIENT BIOGRAPHICAL: Name: Jermia, Rigsby Patient ID: [id number] Birth Date: 21-Aug-1950 Height: 65.0 in. Gender: Female Exam Date: 04/01/2020 Weight: 182.4 lbs. Indications: Caucasian, Postmenopausal, Tobacco User(Current Smoker), History of  Fracture (Adult), History of Breast Cancer, History of Radiation, Rheumatoid Arthritis Fractures: Right forearm Treatments: Anastrozole, Boniva, Calcium, Hyzaar, Multi-Vitamin, Vitamin D DENSITOMETRY RESULTS: Site      Region     Measured Date Measured Age WHO Classification Young Adult T-score BMD         %Change vs. Previous Significant Change (*) AP Spine L1-L4 04/01/2020 66.9 Osteopenia -2.1 0.938 g/cm2 -1.7% - AP Spine L1-L4 04/01/2019 65.9 Osteopenia -1.9 0.954 g/cm2 8.4% Yes AP Spine L1-L4 03/27/2018 64.9 Osteoporosis -2.5 0.880 g/cm2 -4.8% Yes AP Spine L1-L4 05/02/2017 64.0 Osteopenia -2.2 0.924 g/cm2 2.1% - AP Spine L1-L4 10/02/2012 59.4 Osteopenia -2.3 0.905 g/cm2 - - DualFemur Neck Left 04/01/2020 66.9 Osteopenia -1.6 0.817 g/cm2 -1.6% - DualFemur Neck Left 04/01/2019 65.9 Osteopenia -1.5 0.830 g/cm2 2.5% - DualFemur Neck Left 03/27/2018 64.9 Osteopenia -1.6 0.810 g/cm2 4.8% - DualFemur Neck Left 05/02/2017 64.0 Osteopenia -1.9 0.773 g/cm2 -1.4% - DualFemur Neck Left 08/27/2014 61.3 Osteopenia -1.8 0.784 g/cm2 -4.7% - DualFemur Neck Left 10/02/2012 59.4 Osteopenia -1.5 0.823 g/cm2 - -  DualFemur Total Mean 04/01/2020 66.9 Osteopenia -1.3 0.841 g/cm2 -3.1% - DualFemur Total Mean 04/01/2019 65.9 Osteopenia -1.1 0.868 g/cm2 -2.4% - DualFemur Total Mean 03/27/2018 64.9 Normal -0.9 0.889 g/cm2 1.3% - DualFemur Total Mean 05/02/2017 64.0 Normal -1.0 0.878 g/cm2 1.5% - DualFemur Total Mean 08/27/2014 61.3 Osteopenia -1.1 0.865 g/cm2 -3.1% - DualFemur Total Mean 10/02/2012 59.4 Normal -0.9 0.893 g/cm2 - - ASSESSMENT: The BMD measured at AP Spine L1-L4 is 0.938 g/cm2 with a T-score of -2.1. This patient is considered OSTEOPENIC according to [facility]) criteria. Compared with prior study, there has been no significant change in the spine. Compared with prior study, there has been no significant change in the total hip. Patient is not a candidate for FRAX assessment due to [facility]. World Social worker [facility]) criteria for post-menopausal, Caucasian Women: Normal:                   T-score at or above -1 SD Osteopenia/low bone mass: T-score between -1 and -2.5 SD Osteoporosis:             T-score at or below -2.5 SD RECOMMENDATIONS: 1. All patients should optimize calcium and vitamin D intake. 2. Consider FDA-approved medical therapies in postmenopausal women and men aged 2 years and older, based on the following: a. A hip or vertebral(clinical or morphometric) fracture b. T-score < -2.5 at the femoral neck or spine after appropriate evaluation to exclude secondary causes c. Low bone mass (T-score between -1.0 and -2.5 at the femoral neck or spine) and a 10-year probability of a hip fracture > 3% or a 10-year probability of a major osteoporosis-related fracture > 20% based on the [place]-adapted WHO algorithm 3. Clinician judgment and/or patient preferences may indicate treatment for people with 10-year fracture probabilities above or below these levels FOLLOW-UP: People with diagnosed cases of osteoporosis or at high risk for fracture should have regular bone mineral density tests. For patients eligible for Medicare, routine testing is allowed once every 2 years. The testing frequency can be increased to one year for patients who have rapidly progressing disease, those who are receiving or discontinuing medical therapy to restore bone mass, or have additional risk factors. I have reviewed this report, and agree with the above findings. Mark A. Thornton Papas, M.D. [facility] Electronically Signed   By: Lavonia Dana M.D.   On: 04/01/2020 11:59   MM DIAG BREAST TOMO BILATERAL  Result Date: 04/01/2020 CLINICAL DATA:  LEFT lumpectomy 2017 EXAM: DIGITAL DIAGNOSTIC BILATERAL MAMMOGRAM WITH TOMO AND CAD COMPARISON:  Previous exam(s). ACR Breast Density Category b: There are scattered areas of fibroglandular density. FINDINGS: There is density and architectural distortion within the LEFT breast,  consistent with postsurgical changes. These are stable in comparison to prior. No suspicious mass, distortion, or microcalcifications are identified to suggest presence of malignancy. Mammographic images were processed with CAD. IMPRESSION: No mammographic evidence of malignancy. RECOMMENDATION: Diagnostic mammogram is suggested in 1 year. (Code:DM-B-01Y) I have discussed the findings and recommendations with the patient. If applicable, a reminder letter will be sent to the patient regarding the next appointment. BI-RADS CATEGORY  2: Benign. Electronically Signed   By: Valentino Saxon MD   On: 04/01/2020 11:05    ASSESSMENT: Pathologic stage IA ER/PR positive, HER-2 negative invasive carcinoma of the upper outer quadrant of the left breast. Low risk MammoPrint.  PLAN:    1. Pathologic stage IA ER/PR positive, HER-2 negative invasive carcinoma of the upper outer quadrant of the  left breast: Patient underwent lumpectomy on March 22, 2016.  MammoPrint was reported as low risk, therefore she did not require adjuvant chemotherapy. Patient competed MammoSite XRT.  Patient discontinued letrozole secondary to hair loss, but now is tolerating anastrozole well without significant side effects.  Continue treatment for total 5 years completing in November 2022.  Her most recent mammogram on April 01, 2020 was reported as BI-RADS 2.  Repeat in October 2022.  Return to clinic in 6 months for routine evaluation.   2.  Osteopenia: Patient's most recent bone mineral density on April 01, 2020 reported T score of -2.1.  This is slightly decreased from 1 year prior where the T score was -1.9.  Continue calcium, vitamin D, and Boniva.  Will repeat bone mineral density in October 2022.  If it remains essentially stable, can consider discontinuing Boniva at that time.   Patient expressed understanding and was in agreement with this plan. She also understands that She can call clinic at any time with any questions,  concerns, or complaints.   Cancer Staging Malignant neoplasm of female breast [facility]) Staging form: Breast, AJCC 7th Edition - Pathologic stage from 04/04/2016: Stage IA (T1b, N0, cM0) - Signed by Lloyd Huger, MD on 04/04/2016   Lloyd Huger, MD 04/06/20 3:13 PM

## 2020-04-06 ENCOUNTER — Encounter: Payer: Self-pay | Admitting: Oncology

## 2020-04-06 ENCOUNTER — Other Ambulatory Visit: Payer: Self-pay

## 2020-04-06 ENCOUNTER — Inpatient Hospital Stay: Payer: Medicare Other | Attending: Oncology | Admitting: Oncology

## 2020-04-06 VITALS — BP 128/74 | HR 87 | Temp 97.5°F | Resp 20 | Wt 179.5 lb

## 2020-04-06 DIAGNOSIS — M8580 Other specified disorders of bone density and structure, unspecified site: Secondary | ICD-10-CM | POA: Diagnosis not present

## 2020-04-06 DIAGNOSIS — Z170 Estrogen receptor positive status [ER+]: Secondary | ICD-10-CM | POA: Diagnosis not present

## 2020-04-06 DIAGNOSIS — C50412 Malignant neoplasm of upper-outer quadrant of left female breast: Secondary | ICD-10-CM | POA: Insufficient documentation

## 2020-04-07 DIAGNOSIS — Z23 Encounter for immunization: Secondary | ICD-10-CM | POA: Diagnosis not present

## 2020-04-07 DIAGNOSIS — C50412 Malignant neoplasm of upper-outer quadrant of left female breast: Secondary | ICD-10-CM | POA: Diagnosis not present

## 2020-04-07 DIAGNOSIS — Z7689 Persons encountering health services in other specified circumstances: Secondary | ICD-10-CM | POA: Diagnosis not present

## 2020-04-07 DIAGNOSIS — M06041 Rheumatoid arthritis without rheumatoid factor, right hand: Secondary | ICD-10-CM | POA: Diagnosis not present

## 2020-04-07 DIAGNOSIS — I10 Essential (primary) hypertension: Secondary | ICD-10-CM | POA: Diagnosis not present

## 2020-04-07 DIAGNOSIS — Z780 Asymptomatic menopausal state: Secondary | ICD-10-CM | POA: Diagnosis not present

## 2020-04-07 DIAGNOSIS — Z1159 Encounter for screening for other viral diseases: Secondary | ICD-10-CM | POA: Diagnosis not present

## 2020-04-07 DIAGNOSIS — M8580 Other specified disorders of bone density and structure, unspecified site: Secondary | ICD-10-CM | POA: Diagnosis not present

## 2020-04-07 DIAGNOSIS — Z79899 Other long term (current) drug therapy: Secondary | ICD-10-CM | POA: Diagnosis not present

## 2020-04-07 DIAGNOSIS — Z170 Estrogen receptor positive status [ER+]: Secondary | ICD-10-CM | POA: Diagnosis not present

## 2020-04-07 DIAGNOSIS — M06042 Rheumatoid arthritis without rheumatoid factor, left hand: Secondary | ICD-10-CM | POA: Diagnosis not present

## 2020-05-06 ENCOUNTER — Other Ambulatory Visit: Payer: Self-pay | Admitting: Oncology

## 2020-05-06 DIAGNOSIS — C50412 Malignant neoplasm of upper-outer quadrant of left female breast: Secondary | ICD-10-CM

## 2020-05-06 DIAGNOSIS — M8580 Other specified disorders of bone density and structure, unspecified site: Secondary | ICD-10-CM

## 2020-05-12 DIAGNOSIS — H25041 Posterior subcapsular polar age-related cataract, right eye: Secondary | ICD-10-CM | POA: Diagnosis not present

## 2020-05-12 DIAGNOSIS — E7800 Pure hypercholesterolemia, unspecified: Secondary | ICD-10-CM | POA: Diagnosis not present

## 2020-05-13 ENCOUNTER — Encounter: Payer: Self-pay | Admitting: Ophthalmology

## 2020-05-13 ENCOUNTER — Other Ambulatory Visit: Payer: Self-pay

## 2020-05-15 ENCOUNTER — Other Ambulatory Visit: Payer: Self-pay

## 2020-05-15 ENCOUNTER — Other Ambulatory Visit
Admission: RE | Admit: 2020-05-15 | Discharge: 2020-05-15 | Disposition: A | Discharge status: Home / Self Care | Payer: Medicare Other | Source: Ambulatory Visit | Attending: Ophthalmology | Admitting: Ophthalmology

## 2020-05-15 DIAGNOSIS — Z20822 Contact with and (suspected) exposure to covid-19: Secondary | ICD-10-CM | POA: Diagnosis not present

## 2020-05-15 DIAGNOSIS — Z01812 Encounter for preprocedural laboratory examination: Secondary | ICD-10-CM | POA: Insufficient documentation

## 2020-05-15 NOTE — Discharge Instructions (Signed)

## 2020-05-16 LAB — SARS CORONAVIRUS 2 (TAT 6-24 HRS): SARS Coronavirus 2: NEGATIVE

## 2020-05-19 ENCOUNTER — Ambulatory Visit: Payer: Medicare Other | Admitting: Anesthesiology

## 2020-05-19 ENCOUNTER — Ambulatory Visit
Admission: RE | Admit: 2020-05-19 | Discharge: 2020-05-19 | Disposition: A | Discharge status: Home / Self Care | Payer: Medicare Other | Attending: Ophthalmology | Admitting: Ophthalmology

## 2020-05-19 ENCOUNTER — Encounter
Admission: RE | Disposition: A | Discharge status: Home / Self Care | Payer: Self-pay | Source: Home / Self Care | Attending: Ophthalmology

## 2020-05-19 ENCOUNTER — Other Ambulatory Visit: Payer: Self-pay

## 2020-05-19 ENCOUNTER — Encounter: Payer: Self-pay | Admitting: Ophthalmology

## 2020-05-19 DIAGNOSIS — Z79899 Other long term (current) drug therapy: Secondary | ICD-10-CM | POA: Insufficient documentation

## 2020-05-19 DIAGNOSIS — F17210 Nicotine dependence, cigarettes, uncomplicated: Secondary | ICD-10-CM | POA: Diagnosis not present

## 2020-05-19 DIAGNOSIS — H2511 Age-related nuclear cataract, right eye: Secondary | ICD-10-CM | POA: Insufficient documentation

## 2020-05-19 DIAGNOSIS — Z853 Personal history of malignant neoplasm of breast: Secondary | ICD-10-CM | POA: Insufficient documentation

## 2020-05-19 DIAGNOSIS — Z923 Personal history of irradiation: Secondary | ICD-10-CM | POA: Insufficient documentation

## 2020-05-19 DIAGNOSIS — H25811 Combined forms of age-related cataract, right eye: Secondary | ICD-10-CM | POA: Diagnosis not present

## 2020-05-19 DIAGNOSIS — H25041 Posterior subcapsular polar age-related cataract, right eye: Secondary | ICD-10-CM | POA: Diagnosis not present

## 2020-05-19 DIAGNOSIS — Z7982 Long term (current) use of aspirin: Secondary | ICD-10-CM | POA: Insufficient documentation

## 2020-05-19 HISTORY — PX: CATARACT EXTRACTION W/PHACO: SHX586

## 2020-05-19 HISTORY — DX: Acute nasopharyngitis (common cold): J00

## 2020-05-19 HISTORY — DX: Gastro-esophageal reflux disease without esophagitis: K21.9

## 2020-05-19 SURGERY — PHACOEMULSIFICATION, CATARACT, WITH IOL INSERTION
Anesthesia: Monitor Anesthesia Care | Site: Eye | Laterality: Right

## 2020-05-19 MED ORDER — FENTANYL CITRATE (PF) 100 MCG/2ML IJ SOLN
INTRAMUSCULAR | Status: DC | PRN
  Administered 2020-05-19: 50 ug via INTRAVENOUS

## 2020-05-19 MED ORDER — LACTATED RINGERS IV SOLN: INTRAVENOUS | Status: DC

## 2020-05-19 MED ORDER — TETRACAINE HCL 0.5 % OP SOLN
1.0000 [drp] | OPHTHALMIC | Status: DC | PRN
  Administered 2020-05-19 (×3): 1 [drp] via OPHTHALMIC

## 2020-05-19 MED ORDER — BRIMONIDINE TARTRATE-TIMOLOL 0.2-0.5 % OP SOLN
OPHTHALMIC | Status: DC | PRN
  Administered 2020-05-19: 1 [drp] via OPHTHALMIC

## 2020-05-19 MED ORDER — MIDAZOLAM HCL 2 MG/2ML IJ SOLN
INTRAMUSCULAR | Status: DC | PRN
  Administered 2020-05-19 (×2): 1 mg via INTRAVENOUS

## 2020-05-19 MED ORDER — NA CHONDROIT SULF-NA HYALURON 40-17 MG/ML IO SOLN
INTRAOCULAR | Status: DC | PRN
  Administered 2020-05-19: 1 mL via INTRAOCULAR

## 2020-05-19 MED ORDER — MOXIFLOXACIN HCL 0.5 % OP SOLN
OPHTHALMIC | Status: DC | PRN
  Administered 2020-05-19: 0.2 mL via OPHTHALMIC

## 2020-05-19 MED ORDER — LIDOCAINE HCL (PF) 2 % IJ SOLN
INTRAOCULAR | Status: DC | PRN
  Administered 2020-05-19: 2 mL

## 2020-05-19 MED ORDER — EPINEPHRINE PF 1 MG/ML IJ SOLN
INTRAOCULAR | Status: DC | PRN
  Administered 2020-05-19: 12:00:00 63 mL via OPHTHALMIC

## 2020-05-19 MED ORDER — ARMC OPHTHALMIC DILATING DROPS
1.0000 "application " | OPHTHALMIC | Status: DC | PRN
  Administered 2020-05-19 (×3): 1 via OPHTHALMIC

## 2020-05-19 SURGICAL SUPPLY — 19 items
CANNULA ANT/CHMB 27G (MISCELLANEOUS) ×2 IMPLANT
CANNULA ANT/CHMB 27GA (MISCELLANEOUS) ×6 IMPLANT
GLOVE SURG LX 8.0 MICRO (GLOVE) ×2
GLOVE SURG LX STRL 8.0 MICRO (GLOVE) ×1 IMPLANT
GLOVE SURG TRIUMPH 8.0 PF LTX (GLOVE) ×3 IMPLANT
GOWN STRL REUS W/ TWL LRG LVL3 (GOWN DISPOSABLE) ×2 IMPLANT
GOWN STRL REUS W/TWL LRG LVL3 (GOWN DISPOSABLE) ×6
LENS IOL TECNIS EYHANCE 22.5 (Intraocular Lens) ×2 IMPLANT
MARKER SKIN DUAL TIP RULER LAB (MISCELLANEOUS) ×3 IMPLANT
NDL FILTER BLUNT 18X1 1/2 (NEEDLE) ×1 IMPLANT
NEEDLE FILTER BLUNT 18X 1/2SAF (NEEDLE) ×2
NEEDLE FILTER BLUNT 18X1 1/2 (NEEDLE) ×1 IMPLANT
PACK EYE AFTER SURG (MISCELLANEOUS) ×3 IMPLANT
PACK OPTHALMIC (MISCELLANEOUS) ×3 IMPLANT
PACK PORFILIO (MISCELLANEOUS) ×3 IMPLANT
SYR 3ML LL SCALE MARK (SYRINGE) ×3 IMPLANT
SYR TB 1ML LUER SLIP (SYRINGE) ×3 IMPLANT
WATER STERILE IRR 250ML POUR (IV SOLUTION) ×3 IMPLANT
WIPE NON LINTING 3.25X3.25 (MISCELLANEOUS) ×3 IMPLANT

## 2020-05-19 NOTE — Op Note (Signed)
PREOPERATIVE DIAGNOSIS:  Nuclear sclerotic cataract of the right eye.   POSTOPERATIVE DIAGNOSIS:  Cataract   OPERATIVE PROCEDURE:@   SURGEON:  Birder Robson, MD.   ANESTHESIA:  Anesthesiologist: Marice Potter, MD CRNA: Cameron Ali, CRNA  1.      Managed anesthesia care. 2.      0.49ml of Shugarcaine was instilled in the eye following the paracentesis.   COMPLICATIONS:  None.   TECHNIQUE:   Stop and chop   DESCRIPTION OF PROCEDURE:  The patient was examined and consented in the preoperative holding area where the aforementioned topical anesthesia was applied to the right eye and then brought back to the Operating Room where the right eye was prepped and draped in the usual sterile ophthalmic fashion and a lid speculum was placed. A paracentesis was created with the side port blade and the anterior chamber was filled with viscoelastic. A near clear corneal incision was performed with the steel keratome. A continuous curvilinear capsulorrhexis was performed with a cystotome followed by the capsulorrhexis forceps. Hydrodissection and hydrodelineation were carried out with BSS on a blunt cannula. The lens was removed in a stop and chop  technique and the remaining cortical material was removed with the irrigation-aspiration handpiece. The capsular bag was inflated with viscoelastic and the Technis [id number]  lens was placed in the capsular bag without complication. The remaining viscoelastic was removed from the eye with the irrigation-aspiration handpiece. The wounds were hydrated. The anterior chamber was flushed with BSS and the eye was inflated to physiologic pressure. 0.21ml of Vigamox was placed in the anterior chamber. The wounds were found to be water tight. The eye was dressed with Combigan. The patient was given protective glasses to wear throughout the day and a shield with which to sleep tonight. The patient was also given drops with which to begin a drop regimen today and will  follow-up with me in one day. Implant Name Type Inv. Item Serial No. Manufacturer Lot No. LRB No. Used Action  LENS IOL TECNIS EYHANCE 22.5 - [id number] Intraocular Lens LENS IOL TECNIS EYHANCE 22.5 [id number] JOHNSON   Right 1 Implanted   Procedure(s): CATARACT EXTRACTION PHACO AND INTRAOCULAR LENS PLACEMENT (IOC) RIGHT 12.25 01:08.8  (Right)  Electronically signed: Birder Robson 05/19/2020 11:50 AM

## 2020-05-19 NOTE — Anesthesia Postprocedure Evaluation (Addendum)
Anesthesia Post Note  Patient: Karen Phillips  Procedure(s) Performed: CATARACT EXTRACTION PHACO AND INTRAOCULAR LENS PLACEMENT (IOC) RIGHT 12.25 01:08.8  (Right Eye)     Patient location during evaluation: PACU Anesthesia Type: MAC Level of consciousness: awake and alert Pain management: pain level controlled Vital Signs Assessment: post-procedure vital signs reviewed and stable Respiratory status: spontaneous breathing, nonlabored ventilation, respiratory function stable and patient connected to nasal cannula oxygen Cardiovascular status: stable and blood pressure returned to baseline Postop Assessment: no apparent nausea or vomiting Anesthetic complications: no   No complications documented.  Eveleen Mcnear, Glade Stanford

## 2020-05-19 NOTE — Transfer of Care (Signed)
Immediate Anesthesia Transfer of Care Note  Patient: Karen Phillips  Procedure(s) Performed: CATARACT EXTRACTION PHACO AND INTRAOCULAR LENS PLACEMENT (IOC) RIGHT 12.25 01:08.8  (Right Eye)  Patient Location: PACU  Anesthesia Type: MAC  Level of Consciousness: awake, alert  and patient cooperative  Airway and Oxygen Therapy: Patient Spontanous Breathing and Patient connected to supplemental oxygen  Post-op Assessment: Post-op Vital signs reviewed, Patient's Cardiovascular Status Stable, Respiratory Function Stable, Patent Airway and No signs of Nausea or vomiting  Post-op Vital Signs: Reviewed and stable  Complications: No complications documented.

## 2020-05-19 NOTE — Anesthesia Procedure Notes (Signed)
Procedure Name: MAC Date/Time: 05/19/2020 11:35 AM Performed by: Cameron Ali, CRNA Pre-anesthesia Checklist: Patient identified, Emergency Drugs available, Suction available, Timeout performed and Patient being monitored Patient Re-evaluated:Patient Re-evaluated prior to induction Oxygen Delivery Method: Nasal cannula Placement Confirmation: positive ETCO2

## 2020-05-19 NOTE — H&P (Signed)
[place]   Primary Care Physician:  Gladstone Lighter, MD Ophthalmologist: Dr. George Ina  Pre-Procedure History & Physical: HPI:  Karen Phillips is a 69 y.o. female here for cataract surgery.   Past Medical History:  Diagnosis Date  . Arthritis    hands  . Breast cancer ([place]) 02/15/2016   pT1b, N0; ER 90%, PR 90%, HER-2/neu not overexpressed. Closest margin 1 mm.Mammoprint: Low riisk.  . Cataract   . GERD (gastroesophageal reflux disease)   . Head cold    runny nose started Dec 3  . Hemorrhoids   . History of shingles 2018  . Hyperlipidemia   . Hypertension   . Osteoporosis   . Personal history of radiation therapy 2017   MammoSite XRT    Past Surgical History:  Procedure Laterality Date  . BREAST BIOPSY Left 02/15/2016   INVASIVE MAMMARY CARCINOMA  . BREAST LUMPECTOMY WITH SENTINEL LYMPH NODE BIOPSY Left 03/22/2016   Procedure: BREAST LUMPECTOMY WITH SENTINEL LYMPH NODE BX;  Surgeon: Robert Bellow, MD;  Location: [facility] ORS;  Service: General;  Laterality: Left;  . COLONOSCOPY  2016  . TUBAL LIGATION      Prior to Admission medications   Medication Sig Start Date End Date Taking? Authorizing Provider  anastrozole (ARIMIDEX) 1 MG tablet TAKE 1 TABLET DAILY 09/19/19  Yes Lloyd Huger, MD  atorvastatin (LIPITOR) 20 MG tablet Take 20 mg by mouth daily at 6 PM.    Yes [provider]  calcium carbonate (OSCAL) 1500 (600 Ca) MG TABS tablet Take 1,500 mg by mouth daily with breakfast.   Yes [provider]  hydroxychloroquine (PLAQUENIL) 200 MG tablet Take 400 mg by mouth daily.  04/06/16  Yes [provider]  ibandronate (BONIVA) 150 MG tablet TAKE AS INSTRUCTED BY YOUR PRESCRIBER 05/06/20  Yes Lloyd Huger, MD  losartan-hydrochlorothiazide (HYZAAR) 50-12.5 MG tablet Take 1 tablet by mouth daily.  12/15/15  Yes [provider]  Multiple Vitamin (MULTIVITAMIN) tablet Take 1 tablet by mouth daily.   Yes [provider]  naproxen (NAPROSYN) 500 MG tablet Take 500 mg by mouth in the morning and at bedtime. 09/16/19  Yes [provider]  omeprazole (PRILOSEC) 20 MG capsule Take 20 mg by mouth daily.  02/27/19  Yes [provider]  aspirin EC 81 MG tablet Take 81 mg by mouth daily. Patient not taking: Reported on 05/13/2020    [provider]    Allergies as of 03/19/2020 - Review Complete 10/02/2019  Allergen Reaction Noted  . Ibuprofen Swelling 02/03/2014  . Sulfamethizole Rash 11/21/2012    Family History  Problem Relation Age of Onset  . Colon cancer Mother   . Breast cancer Maternal Aunt   . Prostate cancer Father     Social History   Socioeconomic History  . Marital status: Married    Spouse name: Not on file  . Number of children: Not on file  . Years of education: Not on file  . Highest education level: Not on file  Occupational History  . Not on file  Tobacco Use  . Smoking status: Current Every Day Smoker    Packs/day: 1.00    Years: 40.00    Pack years: 40.00    Types: Cigarettes  . Smokeless tobacco: Never Used  Vaping Use  . Vaping Use: Never used  Substance and Sexual Activity  . Alcohol use: Yes    Alcohol/week: 2.0 standard drinks    Types: 2 Glasses of  wine per week  . Drug use: No  . Sexual activity: Not on file  Other Topics Concern  . Not on file  Social History Narrative  . Not on file   Social Determinants of Health   Financial Resource Strain: Not on file  Food Insecurity: Not on file  Transportation Needs: Not on file  Physical Activity: Not on file  Stress: Not on file  Social Connections: Not on file  Intimate Partner Violence: Not on file    Review of Systems: See HPI, otherwise negative ROS  Physical Exam: BP (!) 155/81   Pulse 83   Temp (!) 97.5 F (36.4 C) (Temporal)   Ht _0  (1.676 m)   Wt 81.6 kg   SpO2 100%   BMI 29.05 kg/m  General:   Alert,  pleasant and cooperative in NAD Head:   Normocephalic and atraumatic. Respiratory:  Normal work of breathing. Heart:  Regular rate and rhythm.  Impression/Plan: Karen Phillips is here for cataract surgery.  Risks, benefits, limitations, and alternatives regarding cataract surgery have been reviewed with the patient.  Questions have been answered.  All parties agreeable.   Birder Robson, MD  05/19/2020, 11:25 AM

## 2020-05-19 NOTE — Anesthesia Preprocedure Evaluation (Signed)
Anesthesia Evaluation  Patient identified by MRN, date of birth, ID band Patient awake    Reviewed: Allergy & Precautions, H&P , NPO status , Patient's Chart, lab work & pertinent test results, reviewed documented beta blocker date and time   Airway Mallampati: II  TM Distance: >3 FB Neck ROM: full    Dental no notable dental hx.    Pulmonary neg pulmonary ROS, Current SmokerPatient did not abstain from smoking.,    Pulmonary exam normal breath sounds clear to auscultation       Cardiovascular Exercise Tolerance: Good hypertension,  Rhythm:regular Rate:Normal     Neuro/Psych negative neurological ROS  negative psych ROS   GI/Hepatic Neg liver ROS, GERD  ,  Endo/Other  negative endocrine ROS  Renal/GU negative Renal ROS  negative genitourinary   Musculoskeletal   Abdominal   Peds  Hematology negative hematology ROS (+)   Anesthesia Other Findings   Reproductive/Obstetrics negative OB ROS                             Anesthesia Physical Anesthesia Plan  ASA: II  Anesthesia Plan: MAC   Post-op Pain Management:    Induction:   PONV Risk Score and Plan: 1 and Midazolam and TIVA  Airway Management Planned:   Additional Equipment:   Intra-op Plan:   Post-operative Plan:   Informed Consent: I have reviewed the patients History and Physical, chart, labs and discussed the procedure including the risks, benefits and alternatives for the proposed anesthesia with the patient or authorized representative who has indicated his/her understanding and acceptance.     Dental Advisory Given  Plan Discussed with: CRNA  Anesthesia Plan Comments:         Anesthesia Quick Evaluation

## 2020-05-20 ENCOUNTER — Encounter: Payer: Self-pay | Admitting: Ophthalmology

## 2020-09-14 ENCOUNTER — Other Ambulatory Visit: Payer: Self-pay | Admitting: Oncology

## 2020-09-15 DIAGNOSIS — M150 Primary generalized (osteo)arthritis: Secondary | ICD-10-CM | POA: Diagnosis not present

## 2020-09-15 DIAGNOSIS — E663 Overweight: Secondary | ICD-10-CM | POA: Diagnosis not present

## 2020-09-15 DIAGNOSIS — M064 Inflammatory polyarthropathy: Secondary | ICD-10-CM | POA: Diagnosis not present

## 2020-09-15 DIAGNOSIS — M810 Age-related osteoporosis without current pathological fracture: Secondary | ICD-10-CM | POA: Diagnosis not present

## 2020-09-15 DIAGNOSIS — M2550 Pain in unspecified joint: Secondary | ICD-10-CM | POA: Diagnosis not present

## 2020-09-15 DIAGNOSIS — Z79899 Other long term (current) drug therapy: Secondary | ICD-10-CM | POA: Diagnosis not present

## 2020-09-15 DIAGNOSIS — Z6829 Body mass index (BMI) 29.0-29.9, adult: Secondary | ICD-10-CM | POA: Diagnosis not present

## 2020-10-03 NOTE — Progress Notes (Signed)
[place]  Telephone:([phone]) [phone] Fax:(336) [phone]  [name] OB: 1950/10/06  MR#: [id number]  KZL#:[id number]  Patient Care Team: Gladstone Lighter, MD as PCP - General (Internal Medicine) Peggyann Juba, NP as Nurse Practitioner Bary Castilla Forest Gleason, MD (General Surgery)   CHIEF COMPLAINT: Pathologic stage IA ER/PR positive, HER-2 negative invasive carcinoma of the upper outer quadrant of the left breast.  INTERVAL HISTORY: Patient returns to clinic today for routine 70-monthevaluation. She continues to feel well and remains asymptomatic.  She is tolerating anastrozole and Boniva well without significant side effects. She has no neurologic complaints.  She denies any recent fevers or illnesses.  She has a good appetite and denies weight loss.  She denies any chest pain, shortness of breath, cough, or hemoptysis.  She denies any nausea, vomiting, constipation, or diarrhea. She has no urinary complaints.  Patient offers no specific complaints today.  REVIEW OF SYSTEMS:   Review of Systems  Constitutional: Negative.  Negative for fever, malaise/fatigue and weight loss.  Respiratory: Negative.  Negative for cough and shortness of breath.   Cardiovascular: Negative.  Negative for chest pain and leg swelling.  Gastrointestinal: Negative.  Negative for abdominal pain and constipation.  Genitourinary: Negative.  Negative for hematuria.  Musculoskeletal: Negative.  Negative for joint pain.  Skin: Negative.  Negative for rash.  Neurological: Negative.  Negative for sensory change, focal weakness and weakness.  Psychiatric/Behavioral: Negative.  The patient is not nervous/anxious and does not have insomnia.    As per HPI. Otherwise, a complete review of systems is negative.   PAST MEDICAL HISTORY: Past Medical History:  Diagnosis Date  . Arthritis    hands  . [place]   pT1b, N0; ER 90%, PR 90%, HER-2/neu not overexpressed. Closest  margin 1 mm.Mammoprint: Low riisk.  . Cataract   . GERD (gastroesophageal reflux disease)   . Head cold    runny nose started Dec 3  . Hemorrhoids   . History of shingles 2018  . Hyperlipidemia   . Hypertension   . Osteoporosis   . Personal history of radiation therapy 2017   MammoSite XRT    PAST SURGICAL HISTORY: Past Surgical History:  Procedure Laterality Date  . BREAST BIOPSY Left 02/15/2016   INVASIVE MAMMARY CARCINOMA  . BREAST LUMPECTOMY WITH SENTINEL LYMPH NODE BIOPSY Left 03/22/2016   Procedure: BREAST LUMPECTOMY WITH SENTINEL LYMPH NODE BX;  Surgeon: JRobert Bellow MD;  Location: [facility] ORS;  Service: General;  Laterality: Left;  . CATARACT EXTRACTION W/PHACO Right 05/19/2020   Procedure: CATARACT EXTRACTION PHACO AND INTRAOCULAR LENS PLACEMENT (IOC) RIGHT 12.25 01:08.8 ;  Surgeon: PBirder Robson MD;  [place] [facility]  Service: Ophthalmology;  Laterality: Right;  . COLONOSCOPY  2016  . TUBAL LIGATION      FAMILY HISTORY: Family History  Problem Relation Age of Onset  . Colon cancer Mother   . Breast cancer Maternal Aunt   . Prostate cancer Father     ADVANCED DIRECTIVES (Y/N):  N  HEALTH MAINTENANCE: Social History   Tobacco Use  . Smoking status: Current Every Day Smoker    Packs/day: 1.00    Years: 40.00    Pack years: 40.00    Types: Cigarettes  . Smokeless tobacco: Never Used  Vaping Use  . Vaping Use: Never used  Substance Use Topics  . Alcohol use: Yes    Alcohol/week: 2.0 standard drinks    Types: 2 Glasses of wine per week  .  Drug use: No     Colonoscopy:  PAP:  Bone density:  Lipid panel:  Allergies  Allergen Reactions  . Ibuprofen Swelling    lips  . Sulfamethizole Swelling    Other reaction(s): UNKNOWN    Current Outpatient Medications  Medication Sig Dispense Refill  . anastrozole (ARIMIDEX) 1 MG tablet TAKE 1 TABLET DAILY 90 tablet 3  . atorvastatin (LIPITOR) 20 MG tablet Take 20 mg by mouth daily at 6  PM.     . calcium carbonate (OSCAL) 1500 (600 Ca) MG TABS tablet Take 1,500 mg by mouth daily with breakfast.    . hydroxychloroquine (PLAQUENIL) 200 MG tablet Take 400 mg by mouth daily.     Marland Kitchen ibandronate (BONIVA) 150 MG tablet TAKE AS INSTRUCTED BY YOUR PRESCRIBER 12 tablet 3  . losartan-hydrochlorothiazide (HYZAAR) 50-12.5 MG tablet Take 1 tablet by mouth daily.     . Multiple Vitamin (MULTIVITAMIN) tablet Take 1 tablet by mouth daily.    . naproxen (NAPROSYN) 500 MG tablet Take 500 mg by mouth in the morning and at bedtime.    Marland Kitchen omeprazole (PRILOSEC) 20 MG capsule Take 20 mg by mouth daily.     Marland Kitchen aspirin EC 81 MG tablet Take 81 mg by mouth daily. (Patient not taking: No sig reported)     No current facility-administered medications for this visit.    OBJECTIVE: Vitals:   10/06/20 1041  BP: 125/79  Pulse: 98  Resp: 20  Temp: 99.2 F (37.3 C)     Body mass index is 28.89 kg/m.    ECOG FS:0 - Asymptomatic  General: Well-developed, well-nourished, no acute distress. Eyes: Pink conjunctiva, anicteric sclera. HEENT: Normocephalic, moist mucous membranes. Lungs: No audible wheezing or coughing. Heart: Regular rate and rhythm. Abdomen: Soft, nontender, no obvious distention. Musculoskeletal: No edema, cyanosis, or clubbing. Neuro: Alert, answering all questions appropriately. Cranial nerves grossly intact. Skin: No rashes or petechiae noted. Psych: Normal affect.   LAB RESULTS:  Lab Results  Component Value Date   NA 141 03/18/2016   K 4.0 03/18/2016   CL 107 03/18/2016   CO2 24 03/18/2016   GLUCOSE 103 (H) 03/18/2016   BUN 21 (H) 03/18/2016   CREATININE 0.72 03/18/2016   CALCIUM 9.5 03/18/2016   GFRNONAA >60 03/18/2016   GFRAA >60 03/18/2016    Lab Results  Component Value Date   HGB 13.7 03/18/2016     STUDIES: No results found.  ASSESSMENT: Pathologic stage IA ER/PR positive, HER-2 negative invasive carcinoma of the upper outer quadrant of the left breast.  Low risk MammoPrint.  PLAN:    1. Pathologic stage IA ER/PR positive, HER-2 negative invasive carcinoma of the upper outer quadrant of the left breast: Patient underwent lumpectomy on March 22, 2016.  MammoPrint was reported as low risk, therefore she did not require adjuvant chemotherapy. Patient competed MammoSite XRT.  Patient discontinued letrozole secondary to hair loss, but now is tolerating anastrozole well without significant side effects.  Continue treatment for total 5 years completing in November 2022.  Her most recent mammogram on April 01, 2020 was reported as BI-RADS 2.  Repeat in October 2022.  Return to clinic in 6 months for routine evaluation at which point patient will be near the completion of her treatment and can be transitioned to yearly visits.   2.  Osteopenia: Patient's most recent bone mineral density on April 01, 2020 reported T score of -2.1.  This is slightly decreased from 1 year prior where the T  score was -1.9.  Continue calcium, vitamin D, and Boniva.  Repeat bone mineral density in October 2022 along with mammogram as above.  If her T score remains stable, can consider discontinuing Boniva at that time.   Patient expressed understanding and was in agreement with this plan. She also understands that She can call clinic at any time with any questions, concerns, or complaints.   Cancer Staging Malignant neoplasm of female breast [facility]) Staging form: Breast, AJCC 7th Edition - Pathologic stage from 04/04/2016: Stage IA (T1b, N0, cM0) - Signed by Lloyd Huger, MD on 04/04/2016 Laterality: Left Estrogen receptor status: Positive Progesterone receptor status: Positive HER2 status: Negative   Lloyd Huger, MD 10/08/20 6:35 AM

## 2020-10-06 ENCOUNTER — Encounter (INDEPENDENT_AMBULATORY_CARE_PROVIDER_SITE_OTHER): Payer: Self-pay

## 2020-10-06 ENCOUNTER — Inpatient Hospital Stay: Payer: Medicare Other | Attending: Oncology | Admitting: Oncology

## 2020-10-06 ENCOUNTER — Encounter: Payer: Self-pay | Admitting: Oncology

## 2020-10-06 DIAGNOSIS — Z170 Estrogen receptor positive status [ER+]: Secondary | ICD-10-CM | POA: Diagnosis not present

## 2020-10-06 DIAGNOSIS — F17210 Nicotine dependence, cigarettes, uncomplicated: Secondary | ICD-10-CM | POA: Diagnosis not present

## 2020-10-06 DIAGNOSIS — I10 Essential (primary) hypertension: Secondary | ICD-10-CM | POA: Diagnosis not present

## 2020-10-06 DIAGNOSIS — Z1231 Encounter for screening mammogram for malignant neoplasm of breast: Secondary | ICD-10-CM

## 2020-10-06 DIAGNOSIS — Z780 Asymptomatic menopausal state: Secondary | ICD-10-CM | POA: Diagnosis not present

## 2020-10-06 DIAGNOSIS — K219 Gastro-esophageal reflux disease without esophagitis: Secondary | ICD-10-CM | POA: Insufficient documentation

## 2020-10-06 DIAGNOSIS — M810 Age-related osteoporosis without current pathological fracture: Secondary | ICD-10-CM | POA: Insufficient documentation

## 2020-10-06 DIAGNOSIS — Z79811 Long term (current) use of aromatase inhibitors: Secondary | ICD-10-CM | POA: Diagnosis not present

## 2020-10-06 DIAGNOSIS — M8580 Other specified disorders of bone density and structure, unspecified site: Secondary | ICD-10-CM | POA: Diagnosis not present

## 2020-10-06 DIAGNOSIS — C50412 Malignant neoplasm of upper-outer quadrant of left female breast: Secondary | ICD-10-CM | POA: Diagnosis not present

## 2020-10-06 DIAGNOSIS — Z923 Personal history of irradiation: Secondary | ICD-10-CM | POA: Insufficient documentation

## 2020-10-06 DIAGNOSIS — Z7982 Long term (current) use of aspirin: Secondary | ICD-10-CM | POA: Insufficient documentation

## 2020-10-06 DIAGNOSIS — Z79899 Other long term (current) drug therapy: Secondary | ICD-10-CM | POA: Insufficient documentation

## 2020-10-06 NOTE — Progress Notes (Signed)
Patient denies any concerns today.  

## 2020-12-31 DIAGNOSIS — H43813 Vitreous degeneration, bilateral: Secondary | ICD-10-CM | POA: Diagnosis not present

## 2021-03-09 DIAGNOSIS — M064 Inflammatory polyarthropathy: Secondary | ICD-10-CM | POA: Diagnosis not present

## 2021-03-09 DIAGNOSIS — Z6827 Body mass index (BMI) 27.0-27.9, adult: Secondary | ICD-10-CM | POA: Diagnosis not present

## 2021-03-09 DIAGNOSIS — Z79899 Other long term (current) drug therapy: Secondary | ICD-10-CM | POA: Diagnosis not present

## 2021-03-09 DIAGNOSIS — E663 Overweight: Secondary | ICD-10-CM | POA: Diagnosis not present

## 2021-03-09 DIAGNOSIS — M150 Primary generalized (osteo)arthritis: Secondary | ICD-10-CM | POA: Diagnosis not present

## 2021-03-23 DIAGNOSIS — Z23 Encounter for immunization: Secondary | ICD-10-CM | POA: Diagnosis not present

## 2021-04-02 NOTE — Progress Notes (Signed)
Karen Phillips  [phone] [phone]  ID: [name] OB: 11/25/1950  MR#: [id number]  PSU#:[id number]  Patient Care Team: Gladstone Lighter, MD as PCP - General (Internal Medicine) Peggyann Juba, NP as Nurse Practitioner Bary Castilla Forest Gleason, MD (General Surgery)   CHIEF COMPLAINT: Pathologic stage IA ER/PR positive, HER-2 negative invasive carcinoma of the upper outer quadrant of the left breast.  INTERVAL HISTORY: Patient returns to clinic today for routine 19-monthevaluation.  She continues to tolerate anastrozole and Boniva well without significant side effects.  She currently feels well and is asymptomatic. She has no neurologic complaints.  She denies any recent fevers or illnesses.  She has a good appetite and denies weight loss.  She denies any chest pain, shortness of breath, cough, or hemoptysis.  She denies any nausea, vomiting, constipation, or diarrhea. She has no urinary complaints.  Patient offers no specific complaints today.  REVIEW OF SYSTEMS:   Review of Systems  Constitutional: Negative.  Negative for fever, malaise/fatigue and weight loss.  Respiratory: Negative.  Negative for cough and shortness of breath.   Cardiovascular: Negative.  Negative for chest pain and leg swelling.  Gastrointestinal: Negative.  Negative for abdominal pain and constipation.  Genitourinary: Negative.  Negative for hematuria.  Musculoskeletal: Negative.  Negative for joint pain.  Skin: Negative.  Negative for rash.  Neurological: Negative.  Negative for sensory change, focal weakness and weakness.  Psychiatric/Behavioral: Negative.  The patient is not nervous/anxious and does not have insomnia.    As per HPI. Otherwise, a complete review of systems is negative.   PAST MEDICAL HISTORY: Past Medical History:  Diagnosis Date   Arthritis    hands   [place]ast [place]   pT1b, N0; ER 90%, PR 90%, HER-2/neu not overexpressed. Closest  margin 1 mm.Mammoprint: Low riisk.   Cataract    GERD (gastroesophageal reflux disease)    Head cold    runny nose started Dec 3   Hemorrhoids    History of shingles 2018   Hyperlipidemia    Hypertension    Osteoporosis    Personal history of radiation therapy 2017   MammoSite XRT    PAST SURGICAL HISTORY: Past Surgical History:  Procedure Laterality Date   BREAST BIOPSY Left 02/15/2016   INVASIVE MAMMARY CARCINOMA   BREAST LUMPECTOMY WITH SENTINEL LYMPH NODE BIOPSY Left 03/22/2016   Procedure: BREAST LUMPECTOMY WITH SENTINEL LYMPH NODE BX;  Surgeon: JRobert Bellow MD;  Location: [facility] OR[facility];  Service: General;  Laterality: Left;   CATARACT EXTRACTION W/PHACO Right 05/19/2020   Procedure: CATARACT EXTRACTION PHACO AND INTRAOCULAR LENS PLACEMENT (IOC) RIGHT 12.25 01:08.8 ;  Surgeon: PBirder Robson MD;  Location: [facility]  Service: Ophthalmology;  Laterality: Right;   COLONOSCOPY  2016   TUBAL LIGATION      FAMILY HISTORY: Family History  Problem Relation Age of Onset   Colon cancer Mother    Breast cancer Maternal Aunt    Prostate cancer Father     ADVANCED DIRECTIVES (Y/N):  N  HEALTH MAINTENANCE: Social History   Tobacco Use   Smoking status: Every Day    Packs/day: 1.00    Years: 40.00    Pack years: 40.00    Types: Cigarettes   Smokeless tobacco: Never  Vaping Use   Vaping Use: Never used  Substance Use Topics   Alcohol use: Yes    Alcohol/week: 2.0 standard drinks    Types: 2 Glasses of wine per week  Drug use: No     Colonoscopy:  PAP:  Bone density:  Lipid panel:  Allergies  Allergen Reactions   Ibuprofen Swelling    lips   Sulfamethizole Swelling    Other reaction(s): UNKNOWN    Current Outpatient Medications  Medication Sig Dispense Refill   anastrozole (ARIMIDEX) 1 MG tablet TAKE 1 TABLET DAILY 90 tablet 3   aspirin EC 81 MG tablet Take 81 mg by mouth daily. (Patient not taking: No sig reported)     atorvastatin  (LIPITOR) 20 MG tablet Take 20 mg by mouth daily at 6 PM.      calcium carbonate (OSCAL) 1500 (600 Ca) MG TABS tablet Take 1,500 mg by mouth daily with breakfast.     hydroxychloroquine (PLAQUENIL) 200 MG tablet Take 400 mg by mouth daily.      ibandronate (BONIVA) 150 MG tablet TAKE AS INSTRUCTED BY YOUR PRESCRIBER 12 tablet 3   losartan-hydrochlorothiazide (HYZAAR) 50-12.5 MG tablet Take 1 tablet by mouth daily.      Multiple Vitamin (MULTIVITAMIN) tablet Take 1 tablet by mouth daily.     naproxen (NAPROSYN) 500 MG tablet Take 500 mg by mouth in the morning and at bedtime.     omeprazole (PRILOSEC) 20 MG capsule Take 20 mg by mouth daily.      No current facility-administered medications for this visit.    OBJECTIVE: Vitals:   04/08/21 1034  BP: 120/71  Pulse: 98  Resp: 16  Temp: (!) 97.1 F (36.2 C)  SpO2: 98%     Body mass index is 27.58 kg/m.    ECOG FS:0 - Asymptomatic  General: Well-developed, well-nourished, no acute distress. Eyes: Pink conjunctiva, anicteric sclera. HEENT: Normocephalic, moist mucous membranes. Breast: Exam deferred today. Lungs: No audible wheezing or coughing. Heart: Regular rate and rhythm. Abdomen: Soft, nontender, no obvious distention. Musculoskeletal: No edema, cyanosis, or clubbing. Neuro: Alert, answering all questions appropriately. Cranial nerves grossly intact. Skin: No rashes or petechiae noted. Psych: Normal affect.   LAB RESULTS:  Lab Results  Component Value Date   NA 141 03/18/2016   K 4.0 03/18/2016   CL 107 03/18/2016   CO2 24 03/18/2016   GLUCOSE 103 (H) 03/18/2016   BUN 21 (H) 03/18/2016   CREATININE 0.72 03/18/2016   CALCIUM 9.5 03/18/2016   GFRNONAA >60 03/18/2016   GFRAA >60 03/18/2016    Lab Results  Component Value Date   HGB 13.7 03/18/2016     STUDIES: DG Bone Density  Result Date: 04/05/2021 EXAM: DUAL X-RAY ABSORPTIOMETRY (DXA) FOR BONE MINERAL DENSITY IMPRESSION: Your patient Karen Phillips  completed a BMD test on 04/05/2021 using the [place] (software version: 14.10) manufactured by [employer]. The following summarizes the results of our evaluation. Technologist :crr PATIENT BIOGRAPHICAL: Name: Karen Phillips Patient ID: [id number] Birth Date: 1950-11-22 Height: 66.0 in. Gender: Female Exam Date: 04/05/2021 Weight: 169.6 lbs. Indications: Advanced Age, Caucasian, High Risk Meds, History of Breast Cancer, History of Fracture (Adult), History of Radiation, Postmenopausal, Rheumatoid Arthritis, Tobacco User(Current Smoker) Fractures: Right forearm Treatments: Anastrozole, Boniva, calcium w/ vit D, Hyzaar, Multi-Vitamin DENSITOMETRY RESULTS: Site      Region     Measured Date Measured Age WHO Classification Young Adult T-score BMD         %Change vs. Previous Significant Change (*) AP Spine L1-L4 04/05/2021 67.9 Osteopenia -2.0 0.945 g/cm2 -1.8% - AP Spine L1-L4 04/01/2020 66.9 Osteopenia -1.9 0.962 g/cm2 2.6% - AP Spine L1-L4 04/01/2020 66.9 Osteopenia -  2.1 0.938 g/cm2 -1.7% - AP Spine L1-L4 04/01/2019 65.9 Osteopenia -1.9 0.954 g/cm2 8.4% Yes AP Spine L1-L4 03/27/2018 64.9 Osteoporosis -2.5 0.880 g/cm2 -4.8% Yes AP Spine L1-L4 05/02/2017 64.0 Osteopenia -2.2 0.924 g/cm2 2.1% - AP Spine L1-L4 10/02/2012 59.4 Osteopenia -2.3 0.905 g/cm2 - - DualFemur Neck Right 04/05/2021 67.9 Osteopenia -1.5 0.831 g/cm2 -0.1% - DualFemur Neck Right 04/01/2020 66.9 Osteopenia -1.5 0.832 g/cm2 -1.0% - DualFemur Neck Right 04/01/2020 66.9 Osteopenia -1.4 0.840 g/cm2 0.7% - DualFemur Neck Right 04/01/2019 65.9 Osteopenia -1.5 0.834 g/cm2 -2.9% - DualFemur Neck Right 03/27/2018 64.9 Osteopenia -1.3 0.859 g/cm2 2.3% - DualFemur Neck Right 05/02/2017 64.0 Osteopenia -1.4 0.840 g/cm2 -0.1% - DualFemur Neck Right 08/27/2014 61.3 Osteopenia -1.4 0.841 g/cm2 -0.8% - DualFemur Neck Right 10/02/2012 59.4 Osteopenia -1.4 0.848 g/cm2 - - DualFemur Total Mean 04/05/2021 67.9 Osteopenia -1.3 0.841 g/cm2  0.5% - DualFemur Total Mean 04/01/2020 66.9 Osteopenia -1.4 0.837 g/cm2 -0.5% - DualFemur Total Mean 04/01/2020 66.9 Osteopenia -1.3 0.841 g/cm2 -3.1% Yes DualFemur Total Mean 04/01/2019 65.9 Osteopenia -1.1 0.868 g/cm2 -2.4% Yes DualFemur Total Mean 03/27/2018 64.9 Normal -0.9 0.889 g/cm2 1.3% - DualFemur Total Mean 05/02/2017 64.0 Normal -1.0 0.878 g/cm2 1.5% - DualFemur Total Mean 08/27/2014 61.3 Osteopenia -1.1 0.865 g/cm2 -3.1% Yes DualFemur Total Mean 10/02/2012 59.4 Normal -0.9 0.893 g/cm2 - - ASSESSMENT: The BMD measured at AP Spine L1-L4 is 0.945 g/cm2 with a T-score of -2.0. This patient is considered osteopenic according to [facility]) criteria. The scan quality is good. Patient is not a candidate for FRAX assessment due to [facility] use. World Pharmacologist [facility]) criteria for post-menopausal, Caucasian Women: Normal:                   T-score at or above -1 SD Osteopenia/low bone mass: T-score between -1 and -2.5 SD Osteoporosis:             T-score at or below -2.5 SD RECOMMENDATIONS: 1. All patients should optimize calcium and vitamin D intake. 2. Consider FDA-approved medical therapies in postmenopausal women and men aged 17 years and older, based on the following: a. A hip or vertebral(clinical or morphometric) fracture b. T-score < -2.5 at the femoral neck or spine after appropriate evaluation to exclude secondary causes c. Low bone mass (T-score between -1.0 and -2.5 at the femoral neck or spine) and a 10-year probability of a hip fracture > 3% or a 10-year probability of a major osteoporosis-related fracture > 20% based on the [place]-adapted WHO algorithm 3. Clinician judgment and/or patient preferences may indicate treatment for people with 10-year fracture probabilities above or below these levels FOLLOW-UP: People with diagnosed cases of osteoporosis or at high risk for fracture should have regular bone mineral density tests. For patients eligible for Medicare, routine  testing is allowed once every 2 years. The testing frequency can be increased to one year for patients who have rapidly progressing disease, those who are receiving or discontinuing medical therapy to restore bone mass, or have additional risk factors. I have reviewed this report, and agree with the above findings. [facility] Electronically Signed   By: Lavonia Dana M.D.   On: 04/05/2021 16:11   MM 3D SCREEN BREAST BILATERAL  Result Date: 04/06/2021 CLINICAL DATA:  Screening. EXAM: DIGITAL SCREENING BILATERAL MAMMOGRAM WITH TOMOSYNTHESIS AND CAD TECHNIQUE: Bilateral screening digital craniocaudal and mediolateral oblique mammograms were obtained. Bilateral screening digital breast tomosynthesis was performed. The images were evaluated with computer-aided detection. COMPARISON:  Previous exam(s). ACR Breast  Density Category b: There are scattered areas of fibroglandular density. FINDINGS: There are no findings suspicious for malignancy. IMPRESSION: No mammographic evidence of malignancy. A result letter of this screening mammogram will be mailed directly to the patient. RECOMMENDATION: Screening mammogram in one year. (Code:SM-B-01Y) BI-RADS CATEGORY  1: Negative. Electronically Signed   By: Kristopher Oppenheim M.D.   On: 04/06/2021 12:58   ASSESSMENT: Pathologic stage IA ER/PR positive, HER-2 negative invasive carcinoma of the upper outer quadrant of the left breast. Low risk MammoPrint.  PLAN:    1. Pathologic stage IA ER/PR positive, HER-2 negative invasive carcinoma of the upper outer quadrant of the left breast: Patient underwent lumpectomy on March 22, 2016.  MammoPrint was reported as low risk, therefore she did not require adjuvant chemotherapy. Patient competed MammoSite XRT.  Patient discontinued letrozole secondary to hair loss, but now is tolerating anastrozole well without significant side effects.  Patient has now completed 5 years of anastrozole and has been instructed to  discontinue treatment when her current prescription runs out.  No further intervention is needed at this time.  Her most recent mammogram on April 05, 2021 was reported as BI-RADS 2.  Repeat in November 2023.  Return to clinic in 1 year after her mammogram for routine evaluation.   2.  Osteopenia: Patient's most recent bone mineral density on April 05, 2021 reported T score of -2.0 which is essentially unchanged for the past 2 years where her T score was reported as -2.1 and -1.9.  She has been instructed to discontinue Boniva, but continue calcium and vitamin D supplementation.  Repeat bone mineral density along with mammogram as above.     Patient expressed understanding and was in agreement with this plan. She also understands that She can call clinic at any time with any questions, concerns, or complaints.   Cancer Staging Malignant neoplasm of female breast [facility]) Staging form: Breast, AJCC 7th Edition - Pathologic stage from 04/04/2016: Stage IA (T1b, N0, cM0) - Signed by Lloyd Huger, MD on 04/04/2016 Laterality: Left Estrogen receptor status: Positive Progesterone receptor status: Positive HER2 status: Negative   Lloyd Huger, MD 04/08/21 12:29 PM

## 2021-04-05 ENCOUNTER — Ambulatory Visit
Admission: RE | Admit: 2021-04-05 | Discharge: 2021-04-05 | Disposition: A | Discharge status: Home / Self Care | Payer: Medicare Other | Source: Ambulatory Visit | Attending: Oncology | Admitting: Oncology

## 2021-04-05 ENCOUNTER — Other Ambulatory Visit: Payer: Self-pay

## 2021-04-05 DIAGNOSIS — Z1231 Encounter for screening mammogram for malignant neoplasm of breast: Secondary | ICD-10-CM | POA: Diagnosis not present

## 2021-04-05 DIAGNOSIS — Z780 Asymptomatic menopausal state: Secondary | ICD-10-CM | POA: Diagnosis not present

## 2021-04-05 DIAGNOSIS — M8589 Other specified disorders of bone density and structure, multiple sites: Secondary | ICD-10-CM | POA: Diagnosis not present

## 2021-04-08 ENCOUNTER — Other Ambulatory Visit: Payer: Self-pay

## 2021-04-08 ENCOUNTER — Inpatient Hospital Stay: Payer: Medicare Other | Attending: Oncology | Admitting: Oncology

## 2021-04-08 VITALS — BP 120/71 | HR 98 | Temp 97.1°F | Resp 16 | Wt 170.9 lb

## 2021-04-08 DIAGNOSIS — Z853 Personal history of malignant neoplasm of breast: Secondary | ICD-10-CM | POA: Insufficient documentation

## 2021-04-08 DIAGNOSIS — Z79811 Long term (current) use of aromatase inhibitors: Secondary | ICD-10-CM

## 2021-04-08 DIAGNOSIS — C50412 Malignant neoplasm of upper-outer quadrant of left female breast: Secondary | ICD-10-CM

## 2021-04-08 DIAGNOSIS — Z1231 Encounter for screening mammogram for malignant neoplasm of breast: Secondary | ICD-10-CM

## 2021-04-08 DIAGNOSIS — M8580 Other specified disorders of bone density and structure, unspecified site: Secondary | ICD-10-CM | POA: Insufficient documentation

## 2021-04-08 DIAGNOSIS — Z170 Estrogen receptor positive status [ER+]: Secondary | ICD-10-CM | POA: Diagnosis not present

## 2021-04-08 NOTE — Progress Notes (Signed)
Pt has no concerns/complaints at this time. 

## 2021-05-05 DIAGNOSIS — Z79899 Other long term (current) drug therapy: Secondary | ICD-10-CM | POA: Diagnosis not present

## 2021-05-05 DIAGNOSIS — R739 Hyperglycemia, unspecified: Secondary | ICD-10-CM | POA: Diagnosis not present

## 2021-05-05 DIAGNOSIS — C50412 Malignant neoplasm of upper-outer quadrant of left female breast: Secondary | ICD-10-CM | POA: Diagnosis not present

## 2021-05-05 DIAGNOSIS — Z170 Estrogen receptor positive status [ER+]: Secondary | ICD-10-CM | POA: Diagnosis not present

## 2021-05-05 DIAGNOSIS — M06041 Rheumatoid arthritis without rheumatoid factor, right hand: Secondary | ICD-10-CM | POA: Diagnosis not present

## 2021-05-05 DIAGNOSIS — M8588 Other specified disorders of bone density and structure, other site: Secondary | ICD-10-CM | POA: Diagnosis not present

## 2021-05-05 DIAGNOSIS — Z0000 Encounter for general adult medical examination without abnormal findings: Secondary | ICD-10-CM | POA: Diagnosis not present

## 2021-05-05 DIAGNOSIS — Z1211 Encounter for screening for malignant neoplasm of colon: Secondary | ICD-10-CM | POA: Diagnosis not present

## 2021-05-05 DIAGNOSIS — I10 Essential (primary) hypertension: Secondary | ICD-10-CM | POA: Diagnosis not present

## 2021-05-05 DIAGNOSIS — M06042 Rheumatoid arthritis without rheumatoid factor, left hand: Secondary | ICD-10-CM | POA: Diagnosis not present

## 2021-05-05 DIAGNOSIS — Z780 Asymptomatic menopausal state: Secondary | ICD-10-CM | POA: Diagnosis not present

## 2021-11-23 DIAGNOSIS — K635 Polyp of colon: Secondary | ICD-10-CM | POA: Diagnosis not present

## 2021-11-23 DIAGNOSIS — K5730 Diverticulosis of large intestine without perforation or abscess without bleeding: Secondary | ICD-10-CM | POA: Diagnosis not present

## 2021-11-23 DIAGNOSIS — K640 First degree hemorrhoids: Secondary | ICD-10-CM | POA: Diagnosis not present

## 2021-11-23 DIAGNOSIS — D125 Benign neoplasm of sigmoid colon: Secondary | ICD-10-CM | POA: Diagnosis not present

## 2021-11-23 DIAGNOSIS — Z800 Family history of malignant neoplasm of digestive organs: Secondary | ICD-10-CM | POA: Diagnosis not present

## 2021-11-23 DIAGNOSIS — Z1211 Encounter for screening for malignant neoplasm of colon: Secondary | ICD-10-CM | POA: Diagnosis not present

## 2021-11-25 DIAGNOSIS — M05741 Rheumatoid arthritis with rheumatoid factor of right hand without organ or systems involvement: Secondary | ICD-10-CM | POA: Diagnosis not present

## 2021-11-25 DIAGNOSIS — Z79899 Other long term (current) drug therapy: Secondary | ICD-10-CM | POA: Diagnosis not present

## 2022-03-10 ENCOUNTER — Telehealth: Payer: Self-pay | Admitting: Oncology

## 2022-03-10 ENCOUNTER — Other Ambulatory Visit: Payer: Self-pay

## 2022-03-10 ENCOUNTER — Other Ambulatory Visit: Payer: Self-pay | Admitting: Oncology

## 2022-03-10 DIAGNOSIS — Z170 Estrogen receptor positive status [ER+]: Secondary | ICD-10-CM

## 2022-03-10 DIAGNOSIS — Z1231 Encounter for screening mammogram for malignant neoplasm of breast: Secondary | ICD-10-CM

## 2022-03-10 DIAGNOSIS — Z79811 Long term (current) use of aromatase inhibitors: Secondary | ICD-10-CM

## 2022-03-10 NOTE — Telephone Encounter (Signed)
New order placed- Abbie will you please schedule and call patient to let her know the date/time of the appointment. Thank you!

## 2022-03-10 NOTE — Telephone Encounter (Signed)
pt called in to req new order for Bone Density, She states that by the time she is able to have it done current order will be expired

## 2022-03-18 DIAGNOSIS — Z23 Encounter for immunization: Secondary | ICD-10-CM | POA: Diagnosis not present

## 2022-04-11 ENCOUNTER — Ambulatory Visit: Payer: Medicare Other | Admitting: Nurse Practitioner

## 2022-05-12 ENCOUNTER — Ambulatory Visit
Admission: RE | Admit: 2022-05-12 | Discharge: 2022-05-12 | Disposition: A | Discharge status: Home / Self Care | Payer: Medicare Other | Source: Ambulatory Visit | Attending: Oncology | Admitting: Oncology

## 2022-05-12 ENCOUNTER — Other Ambulatory Visit: Payer: Self-pay | Admitting: Oncology

## 2022-05-12 DIAGNOSIS — R928 Other abnormal and inconclusive findings on diagnostic imaging of breast: Secondary | ICD-10-CM

## 2022-05-12 DIAGNOSIS — N63 Unspecified lump in unspecified breast: Secondary | ICD-10-CM

## 2022-05-12 DIAGNOSIS — M8589 Other specified disorders of bone density and structure, multiple sites: Secondary | ICD-10-CM | POA: Diagnosis not present

## 2022-05-12 DIAGNOSIS — Z170 Estrogen receptor positive status [ER+]: Secondary | ICD-10-CM

## 2022-05-12 DIAGNOSIS — Z79811 Long term (current) use of aromatase inhibitors: Secondary | ICD-10-CM

## 2022-05-12 DIAGNOSIS — Z1231 Encounter for screening mammogram for malignant neoplasm of breast: Secondary | ICD-10-CM | POA: Insufficient documentation

## 2022-05-12 DIAGNOSIS — C50412 Malignant neoplasm of upper-outer quadrant of left female breast: Secondary | ICD-10-CM | POA: Insufficient documentation

## 2022-05-12 DIAGNOSIS — Z780 Asymptomatic menopausal state: Secondary | ICD-10-CM | POA: Diagnosis not present

## 2022-05-12 DIAGNOSIS — N6489 Other specified disorders of breast: Secondary | ICD-10-CM

## 2022-05-17 ENCOUNTER — Encounter: Payer: Self-pay | Admitting: Nurse Practitioner

## 2022-05-17 ENCOUNTER — Other Ambulatory Visit: Payer: Self-pay

## 2022-05-17 ENCOUNTER — Inpatient Hospital Stay: Payer: Medicare Other | Attending: Nurse Practitioner | Admitting: Nurse Practitioner

## 2022-05-17 VITALS — BP 141/81 | HR 95 | Temp 98.1°F | Resp 20 | Ht 66.0 in | Wt 163.0 lb

## 2022-05-17 DIAGNOSIS — Z923 Personal history of irradiation: Secondary | ICD-10-CM | POA: Insufficient documentation

## 2022-05-17 DIAGNOSIS — Z08 Encounter for follow-up examination after completed treatment for malignant neoplasm: Secondary | ICD-10-CM | POA: Diagnosis not present

## 2022-05-17 DIAGNOSIS — Z170 Estrogen receptor positive status [ER+]: Secondary | ICD-10-CM | POA: Diagnosis not present

## 2022-05-17 DIAGNOSIS — Z853 Personal history of malignant neoplasm of breast: Secondary | ICD-10-CM | POA: Diagnosis not present

## 2022-05-17 DIAGNOSIS — M8580 Other specified disorders of bone density and structure, unspecified site: Secondary | ICD-10-CM | POA: Diagnosis not present

## 2022-05-17 NOTE — Progress Notes (Signed)
Pt here to discuss abn mammogram. She requires additional views, which has been scheduled for 12/26.

## 2022-05-17 NOTE — Progress Notes (Signed)
[facility]  [phone] [phone]  [name] OB: 11/13/1950  MR#: [id number]  CSN#:[id number]  Patient Care Team: Kalisetti, Radhika, MD as PCP - General (Internal Medicine) Smith, Donna C, NP as Nurse Practitioner [name], Jeffrey W, MD (General Surgery)  CHIEF COMPLAINT: Pathologic stage IA ER/PR positive, HER-2 negative invasive carcinoma of the upper outer quadrant of the left breast.  INTERVAL HISTORY: Patient returns to clinic today for routine 6-month evaluation.  She completed 5 years of anastrozole last year and also discontinued boniva. She remains asymptomatic of recurrence and reports feeling well. No unintentional weight loss. No new pain. No skin or chest masses. She feels well.   REVIEW OF SYSTEMS:   Review of Systems  Constitutional: Negative.  Negative for fever, malaise/fatigue and weight loss.  Respiratory: Negative.  Negative for cough and shortness of breath.   Cardiovascular: Negative.  Negative for chest pain and leg swelling.  Gastrointestinal: Negative.  Negative for abdominal pain and constipation.  Genitourinary: Negative.  Negative for hematuria.  Musculoskeletal: Negative.  Negative for joint pain.  Skin: Negative.  Negative for rash.  Neurological: Negative.  Negative for sensory change, focal weakness and weakness.  Psychiatric/Behavioral: Negative.  The patient is not nervous/anxious and does not have insomnia.   As per HPI. Otherwise, a complete review of systems is negative.   PAST MEDICAL HISTORY: Past Medical History:  Diagnosis Date   Arthritis    hands   Breast cancer (HCC) 02/15/2016   pT1b, N0; ER 90%, PR 90%, HER-2/neu not overexpressed. Closest margin 1 mm.Mammoprint: Low riisk.   Cataract    GERD (gastroesophageal reflux disease)    Head cold    runny nose started Dec 3   Hemorrhoids    History of shingles 2018   Hyperlipidemia    Hypertension    Osteoporosis    Personal  history of radiation therapy 2017   MammoSite XRT    PAST SURGICAL HISTORY: Past Surgical History:  Procedure Laterality Date   BREAST BIOPSY Left 02/15/2016   INVASIVE MAMMARY CARCINOMA   BREAST LUMPECTOMY WITH SENTINEL LYMPH NODE BIOPSY Left 03/22/2016   Procedure: BREAST LUMPECTOMY WITH SENTINEL LYMPH NODE BX;  Surgeon: Jeffrey W [name], MD;  Location: [facility];  Service: General;  Laterality: Left;   CATARACT EXTRACTION W/PHACO Right 05/19/2020   Procedure: CATARACT EXTRACTION PHACO AND INTRAOCULAR LENS PLACEMENT (IOC) RIGHT 12.25 01:08.8 ;  Surgeon: Porfilio, William, MD;  Location: [facility];  Service: Ophthalmology;  Laterality: Right;   COLONOSCOPY  2016   TUBAL LIGATION      FAMILY HISTORY: Family History  Problem Relation Age of Onset   Colon cancer Mother    Breast cancer Maternal Aunt    Prostate cancer Father     ADVANCED DIRECTIVES (Y/N):  N  HEALTH MAINTENANCE: Social History   Tobacco Use   Smoking status: Every Day    Packs/day: 1.00    Years: 40.00    Total pack years: 40.00    Types: Cigarettes   Smokeless tobacco: Never  Vaping Use   Vaping Use: Never used  Substance Use Topics   Alcohol use: Yes    Alcohol/week: 2.0 standard drinks of alcohol    Types: 2 Glasses of wine per week   Drug use: No     Colonoscopy:  PAP:  Bone density:  Lipid panel:  Allergies  Allergen Reactions   Ibuprofen Swelling    lips   Sulfamethizole Swelling    Other reaction(s):   UNKNOWN    Current Outpatient Medications  Medication Sig Dispense Refill   atorvastatin (LIPITOR) 20 MG tablet Take 2 tablets by mouth daily.     calcium carbonate (OSCAL) 1500 (600 Ca) MG TABS tablet Take 1,500 mg by mouth daily with breakfast.     hydroxychloroquine (PLAQUENIL) 200 MG tablet Take 400 mg by mouth daily.      losartan-hydrochlorothiazide (HYZAAR) 50-12.5 MG tablet Take 1 tablet by mouth daily.      Multiple Vitamin (MULTIVITAMIN) tablet Take 1 tablet by  mouth daily.     naproxen (NAPROSYN) 500 MG tablet Take 500 mg by mouth in the morning and at bedtime.     omeprazole (PRILOSEC) 20 MG capsule Take 20 mg by mouth daily.      No current facility-administered medications for this visit.    OBJECTIVE: Vitals:   05/17/22 1100  BP: (!) 141/81  Pulse: 95  Resp: 20  Temp: 98.1 F (36.7 C)     Body mass index is 26.31 kg/m.    ECOG FS:0 - Asymptomatic  General: Well-developed, well-nourished, no acute distress. Eyes: Pink conjunctiva, anicteric sclera. HEENT: Normocephalic, moist mucous membranes. Breast: Breast exam was performed in seated and lying down position. Patient is status post left lumpectomy with a well-healed surgical scar. No evidence of any palpable masses. No evidence of axillary adenopathy. No evidence of any palpable masses or lumps in the right breast. No evidence of right axillary adenopathy Lungs: No audible wheezing or coughing. Heart: Regular rate and rhythm. Abdomen: Soft, nontender, no obvious distention. Musculoskeletal: No edema, cyanosis, or clubbing. Neuro: Alert, answering all questions appropriately. Cranial nerves grossly intact. Skin: No rashes or petechiae noted. Psych: Normal affect.   LAB RESULTS: Lab Results  Component Value Date   NA 141 03/18/2016   K 4.0 03/18/2016   CL 107 03/18/2016   CO2 24 03/18/2016   GLUCOSE 103 (H) 03/18/2016   BUN 21 (H) 03/18/2016   CREATININE 0.72 03/18/2016   CALCIUM 9.5 03/18/2016   GFRNONAA >60 03/18/2016   GFRAA >60 03/18/2016    Lab Results  Component Value Date   HGB 13.7 03/18/2016     STUDIES: MM 3D SCREEN BREAST BILATERAL  Result Date: 05/12/2022 CLINICAL DATA:  Screening. EXAM: DIGITAL SCREENING BILATERAL MAMMOGRAM WITH TOMOSYNTHESIS AND CAD TECHNIQUE: Bilateral screening digital craniocaudal and mediolateral oblique mammograms were obtained. Bilateral screening digital breast tomosynthesis was performed. The images were evaluated with  computer-aided detection. COMPARISON:  Previous exam(s). ACR Breast Density Category b: There are scattered areas of fibroglandular density. FINDINGS: In the right breast a possible mass requires further evaluation. In the left breast a possible asymmetry requires further evaluation. IMPRESSION: Further evaluation is suggested for possible mass in the right breast. Further evaluation is suggested for possible asymmetry in the left breast. RECOMMENDATION: Diagnostic mammogram and possibly ultrasound of both breasts. (Code:FI-B-39M) The patient will be contacted regarding the findings, and additional imaging will be scheduled. BI-RADS CATEGORY  0: Incomplete. Need additional imaging evaluation and/or prior mammograms for comparison. Electronically Signed   By: Ileana Roup M.D.   On: 05/12/2022 13:50   DG Bone Density  Result Date: 05/12/2022 EXAM: DUAL X-RAY ABSORPTIOMETRY (DXA) FOR BONE MINERAL DENSITY IMPRESSION: Your patient Kemi Hanley completed a BMD test on 05/12/2022 using the [place] (software version: 14.10) manufactured by [employer]. The following summarizes the results of our evaluation. Technologist: SCE PATIENT BIOGRAPHICAL: Name: Chyanne, Paddock Patient ID: [id number] Birth Date: 1950/12/17 Height:  64.0 in. Gender: Female Exam Date: 05/12/2022 Weight: 159.5 lbs. Indications: Advanced Age, Caucasian, Height Loss, History of Breast Cancer, History of Fracture (Adult), History of Radiation, Postmenopausal, Rheumatoid Arthritis, Tobacco User(Current Smoker), Tobacco User (Current Smoker) Fractures: Right forearm Treatments: calcium w/ vit D, Hyzaar, Multi-Vitamin, Omeprazole DENSITOMETRY RESULTS: Site         Region      Measured Date Measured Age WHO Classification Young Adult T-score BMD         %Change vs. Previous Significant Change (*) AP Spine L1-L4 05/12/2024 69.0 Osteopenia -1.7 0.983 g/cm2 4.0% Yes AP Spine L1-L4 04/05/2021 67.9 Osteopenia -2.0 0.945 g/cm2  -1.8% - AP Spine L1-L4 04/01/2020 66.9 Osteopenia -1.9 0.962 g/cm2 2.6% - AP Spine L1-L4 04/01/2020 66.9 Osteopenia -2.1 0.938 g/cm2 -1.7% - AP Spine L1-L4 04/01/2019 65.9 Osteopenia -1.9 0.954 g/cm2 8.4% Yes AP Spine L1-L4 03/27/2018 64.9 Osteoporosis -2.5 0.880 g/cm2 -4.8% Yes AP Spine L1-L4 05/02/2017 64.0 Osteopenia -2.2 0.924 g/cm2 2.1% - AP Spine L1-L4 10/02/2012 59.4 Osteopenia -2.3 0.905 g/cm2 - - DualFemur Total Right 05/12/2022 69.0 Osteopenia -1.8 0.787 g/cm2 -4.3% Yes DualFemur Total Right 04/05/2021 67.9 Osteopenia -1.5 0.822 g/cm2 1.1% - DualFemur Total Right 04/01/2020 66.9 Osteopenia -1.5 0.813 g/cm2 -2.2% - DualFemur Total Right 04/01/2020 66.9 Osteopenia -1.4 0.831 g/cm2 -2.7% Yes DualFemur Total Right 04/01/2019 65.9 Osteopenia -1.2 0.854 g/cm2 -2.4% Yes DualFemur Total Right 03/27/2018 64.9 Osteopenia -1.1 0.875 g/cm2 -0.5% - DualFemur Total Right 05/02/2017 64.0 Normal -1.0 0.879 g/cm2 1.5% - DualFemur Total Right 08/27/2014 61.3 Osteopenia -1.1 0.866 g/cm2 -1.4% - DualFemur Total Right 10/02/2012 59.4 Normal -1.0 0.878 g/cm2 - - DualFemur Total Mean 05/12/2022 69.0 Osteopenia -1.5 0.815 g/cm2 -3.1% Yes DualFemur Total Mean 04/05/2021 67.9 Osteopenia -1.3 0.841 g/cm2 0.5% - DualFemur Total Mean 04/01/2020 66.9 Osteopenia -1.4 0.837 g/cm2 -0.5% - DualFemur Total Mean 04/01/2020 66.9 Osteopenia -1.3 0.841 g/cm2 -3.1% Yes DualFemur Total Mean 04/01/2019 65.9 Osteopenia -1.1 0.868 g/cm2 -2.4% Yes DualFemur Total Mean 03/27/2018 64.9 Normal -0.9 0.889 g/cm2 1.3% - DualFemur Total Mean 05/02/2017 64.0 Normal -1.0 0.878 g/cm2 1.5% - DualFemur Total Mean 08/27/2014 61.3 Osteopenia -1.1 0.865 g/cm2 -3.1% Yes DualFemur Total Mean 10/02/2012 59.4 Normal -0.9 0.893 g/cm2 - - Left Forearm Radius 33% 05/12/2022 69.0 Normal -0.8 0.805 g/cm2 -1.2% - Left Forearm Radius 33% 03/27/2018 64.9 Normal -0.7 0.815 g/cm2 -4.3% Yes Left Forearm Radius 33% 05/02/2017 64.0 Normal -0.3 0.852 g/cm2 2.0% - Left Forearm Radius  33% 08/27/2014 61.3 Normal -0.5 0.835 g/cm2 0.1% - Left Forearm Radius 33% 10/02/2012 59.4 Normal -0.5 0.834 g/cm2 - - ASSESSMENT: The BMD measured at Femur Total Right is 0.787 g/cm2 with a T-score of -1.8. This patient is considered osteopenic according to [facility]) criteria. The scan quality is good. Compared with prior study, there has been a significant increase in the spine. Compared with prior study, there has been a significant decrease in the total hip. [facility]) criteria for post-menopausal, Caucasian Women: Normal:                   T-score at or above -1 SD Osteopenia/low bone mass: T-score between -1 and -2.5 SD Osteoporosis:             T-score at or below -2.5 SD RECOMMENDATIONS: 1. All patients should optimize calcium and vitamin D intake. 2. Consider FDA-approved medical therapies in postmenopausal women and men aged 73 years and older, based on the following: a. A hip or vertebral(clinical or morphometric) fracture b. T-score < -  2.5 at the femoral neck or spine after appropriate evaluation to exclude secondary causes c. Low bone mass (T-score between -1.0 and -2.5 at the femoral neck or spine) and a 10-year probability of a hip fracture > 3% or a 10-year probability of a major osteoporosis-related fracture > 20% based on the [place]-adapted WHO algorithm 3. Clinician judgment and/or patient preferences may indicate treatment for people with 10-year fracture probabilities above or below these levels FOLLOW-UP: People with diagnosed cases of osteoporosis or at high risk for fracture should have regular bone mineral density tests. For patients eligible for Medicare, routine testing is allowed once every 2 years. The testing frequency can be increased to one year for patients who have rapidly progressing disease, those who are receiving or discontinuing medical therapy to restore bone mass, or have additional risk factors. I have reviewed this report, and agree  with the above findings. [facility] Dear Dr Finnegan, Your patient Chrystina E Haen completed a FRAX assessment on 05/12/2022 using the Lunar iDXA DXA System (analysis version: 14.10) manufactured by [employer]. The following summarizes the results of our evaluation. PATIENT BIOGRAPHICAL: Name: Rochefort, Tanasia E Patient ID: [id number] Birth Date: 12/02/1950 Height:    64.0 in. Gender:     [age]    [age]       Weight:    159.5 lbs. Ethnicity:  White                            Exam Date: 05/12/2022 FRAX* RESULTS:  (version: 3.5) 10-year Probability of Fracture1 Major Osteoporotic Fracture2 Hip Fracture 21.9% 5.9% Population: [place] (Caucasian) Risk Factors: History of Fracture (Adult), Rheumatoid Arthritis, Tobacco User (Current Smoker) Based on Femur (Right) Neck BMD 1 -The 10-year probability of fracture may be lower than reported if the patient has received treatment. 2 -Major Osteoporotic Fracture: Clinical Spine, Forearm, Hip or Shoulder *FRAX is a trademark of the [employer] Metabolic Bone Disease, a World Health Organization (WHO) Collaborating Centre. ASSESSMENT: The probability of a major osteoporotic fracture is 21.9% within the next ten years. The probability of a hip fracture is 5.9% within the next ten years. Electronically Signed   By: Michelle  Collins M.D.   On: 05/12/2022 10:04    ASSESSMENT: Pathologic stage IA ER/PR positive, HER-2 negative invasive carcinoma of the upper outer quadrant of the left breast. Low risk MammoPrint.  PLAN:    1. Pathologic stage IA ER/PR positive, HER-2 negative invasive carcinoma of the upper outer quadrant of the left breast: s/p lumpectomy 03/22/16. Mammoprint was low risk therefore she did not require adjuvant chemotherapy. She received MammoSite XRT. Given ER/PR positivity, she started AI but ultimately switched from letrozole to anastrozole d/t hair loss. Tolerated well and she completed 5  years of therapy in 04/2021. Extended AI therapy was not recommended given MammoPrint results. Her most recent mammogram on 05/12/22 was reported as incomplete for possible mass in right breast and possible asymmetry in left breast. She has diagnostic mammogram and ultrasound scheduled for later this month. She will continue annual surveillance with mammogram. Clinically, she is asymptomatic of recurrence.   2.  Osteopenia: Patient's most recent bone mineral density on 12, 7, 2023 was reported as -1.8. This is slightly improved when compared to previous when she had reported t scores of -2, -2.1, and -1.9. She has discontinued boniva but will continue calcium and vitamin   d supplementation along with weight bearing exercise as tolerated. She can repeat boen density scan in 05/2024.   Disposition:  Follow up imaging as recommended: diagnostic mammogram & ultrasound with possible biopsy If normal, rtc in 1 year for continued breast cancer surveillance with myself or Dr. Grayland Ormond.   Patient expressed understanding and was in agreement with this plan. She also understands that She can call clinic at any time with any questions, concerns, or complaints.    Cancer Staging  Malignant neoplasm of female breast [facility]) Staging form: Breast, AJCC 7th Edition - Pathologic stage from 04/04/2016: Stage IA (T1b, N0, cM0) - Signed by Lloyd Huger, MD on 04/04/2016 Laterality: Left Estrogen receptor status: Positive Progesterone receptor status: Positive HER2 status: Negative  Verlon Au, NP 05/17/22

## 2022-05-31 ENCOUNTER — Ambulatory Visit
Admission: RE | Admit: 2022-05-31 | Discharge: 2022-05-31 | Disposition: A | Discharge status: Home / Self Care | Payer: Medicare Other | Source: Ambulatory Visit | Attending: Oncology | Admitting: Oncology

## 2022-05-31 ENCOUNTER — Other Ambulatory Visit: Payer: Self-pay | Admitting: Oncology

## 2022-05-31 DIAGNOSIS — N6489 Other specified disorders of breast: Secondary | ICD-10-CM | POA: Insufficient documentation

## 2022-05-31 DIAGNOSIS — R928 Other abnormal and inconclusive findings on diagnostic imaging of breast: Secondary | ICD-10-CM | POA: Diagnosis not present

## 2022-05-31 DIAGNOSIS — N63 Unspecified lump in unspecified breast: Secondary | ICD-10-CM

## 2022-06-08 ENCOUNTER — Ambulatory Visit
Admission: RE | Admit: 2022-06-08 | Discharge: 2022-06-08 | Disposition: A | Discharge status: Home / Self Care | Payer: Medicare Other | Source: Ambulatory Visit | Attending: Oncology | Admitting: Oncology

## 2022-06-08 DIAGNOSIS — D242 Benign neoplasm of left breast: Secondary | ICD-10-CM | POA: Diagnosis not present

## 2022-06-08 DIAGNOSIS — N6082 Other benign mammary dysplasias of left breast: Secondary | ICD-10-CM | POA: Diagnosis not present

## 2022-06-08 DIAGNOSIS — R928 Other abnormal and inconclusive findings on diagnostic imaging of breast: Secondary | ICD-10-CM | POA: Insufficient documentation

## 2022-06-08 DIAGNOSIS — N63 Unspecified lump in unspecified breast: Secondary | ICD-10-CM | POA: Insufficient documentation

## 2022-06-08 HISTORY — PX: BREAST BIOPSY: SHX20

## 2022-06-08 MED ORDER — LIDOCAINE HCL (PF) 2 % IJ SOLN
15.0000 mL | Freq: Once | INTRAMUSCULAR | Status: AC
  Administered 2022-06-08: 15 mL
  Filled 2022-06-08: qty 15

## 2022-06-08 MED ORDER — LIDOCAINE-EPINEPHRINE (PF) 1 %-1:200000 IJ SOLN
10.0000 mL | Freq: Once | INTRAMUSCULAR | Status: AC
  Administered 2022-06-08: 10 mL
  Filled 2022-06-08: qty 10

## 2022-06-09 LAB — SURGICAL PATHOLOGY

## 2022-11-15 DIAGNOSIS — Z170 Estrogen receptor positive status [ER+]: Secondary | ICD-10-CM | POA: Diagnosis not present

## 2022-11-15 DIAGNOSIS — K219 Gastro-esophageal reflux disease without esophagitis: Secondary | ICD-10-CM | POA: Diagnosis not present

## 2022-11-15 DIAGNOSIS — Z0000 Encounter for general adult medical examination without abnormal findings: Secondary | ICD-10-CM | POA: Diagnosis not present

## 2022-11-15 DIAGNOSIS — I10 Essential (primary) hypertension: Secondary | ICD-10-CM | POA: Diagnosis not present

## 2022-11-15 DIAGNOSIS — G8929 Other chronic pain: Secondary | ICD-10-CM | POA: Diagnosis not present

## 2022-11-15 DIAGNOSIS — D229 Melanocytic nevi, unspecified: Secondary | ICD-10-CM | POA: Diagnosis not present

## 2022-11-15 DIAGNOSIS — M25552 Pain in left hip: Secondary | ICD-10-CM | POA: Diagnosis not present

## 2022-11-15 DIAGNOSIS — C50412 Malignant neoplasm of upper-outer quadrant of left female breast: Secondary | ICD-10-CM | POA: Diagnosis not present

## 2023-03-24 DIAGNOSIS — Z23 Encounter for immunization: Secondary | ICD-10-CM | POA: Diagnosis not present

## 2023-04-04 ENCOUNTER — Other Ambulatory Visit: Payer: Self-pay | Admitting: Internal Medicine

## 2023-04-04 DIAGNOSIS — Z1231 Encounter for screening mammogram for malignant neoplasm of breast: Secondary | ICD-10-CM

## 2023-05-15 ENCOUNTER — Ambulatory Visit
Admission: RE | Admit: 2023-05-15 | Discharge: 2023-05-15 | Disposition: A | Discharge status: Home / Self Care | Payer: Medicare Other | Source: Ambulatory Visit | Attending: Internal Medicine | Admitting: Internal Medicine

## 2023-05-15 DIAGNOSIS — Z1231 Encounter for screening mammogram for malignant neoplasm of breast: Secondary | ICD-10-CM | POA: Diagnosis not present

## 2023-06-08 DIAGNOSIS — L821 Other seborrheic keratosis: Secondary | ICD-10-CM | POA: Diagnosis not present

## 2023-06-08 DIAGNOSIS — D2271 Melanocytic nevi of right lower limb, including hip: Secondary | ICD-10-CM | POA: Diagnosis not present

## 2023-06-08 DIAGNOSIS — D2261 Melanocytic nevi of right upper limb, including shoulder: Secondary | ICD-10-CM | POA: Diagnosis not present

## 2023-06-08 DIAGNOSIS — D225 Melanocytic nevi of trunk: Secondary | ICD-10-CM | POA: Diagnosis not present

## 2023-06-08 DIAGNOSIS — D2262 Melanocytic nevi of left upper limb, including shoulder: Secondary | ICD-10-CM | POA: Diagnosis not present

## 2023-06-08 DIAGNOSIS — D485 Neoplasm of uncertain behavior of skin: Secondary | ICD-10-CM | POA: Diagnosis not present

## 2023-06-08 DIAGNOSIS — L986 Other infiltrative disorders of the skin and subcutaneous tissue: Secondary | ICD-10-CM | POA: Diagnosis not present

## 2023-06-08 DIAGNOSIS — D2272 Melanocytic nevi of left lower limb, including hip: Secondary | ICD-10-CM | POA: Diagnosis not present

## 2023-06-08 DIAGNOSIS — D0361 Melanoma in situ of right upper limb, including shoulder: Secondary | ICD-10-CM | POA: Diagnosis not present

## 2023-06-09 ENCOUNTER — Inpatient Hospital Stay: Payer: Medicare Other | Attending: Nurse Practitioner | Admitting: Nurse Practitioner

## 2023-06-09 ENCOUNTER — Encounter: Payer: Self-pay | Admitting: Nurse Practitioner

## 2023-06-09 VITALS — BP 151/76 | HR 93 | Temp 98.2°F | Wt 157.0 lb

## 2023-06-09 DIAGNOSIS — M8580 Other specified disorders of bone density and structure, unspecified site: Secondary | ICD-10-CM

## 2023-06-09 DIAGNOSIS — Z923 Personal history of irradiation: Secondary | ICD-10-CM | POA: Diagnosis not present

## 2023-06-09 DIAGNOSIS — Z08 Encounter for follow-up examination after completed treatment for malignant neoplasm: Secondary | ICD-10-CM | POA: Diagnosis not present

## 2023-06-09 DIAGNOSIS — Z853 Personal history of malignant neoplasm of breast: Secondary | ICD-10-CM

## 2023-06-09 NOTE — Progress Notes (Signed)
 [facility]  Telephone:([phone] Fax:([phone]  [name] OB: 08-13-1950  MR#: [id number]  RDW#:[id number]  Patient Care Team: [name], MD as PCP - General (Internal Medicine) [name], NP as Nurse Practitioner [name], [name] ORN, MD (General Surgery) [name], MD as Consulting Physician (Oncology)  CHIEF COMPLAINT: Pathologic stage IA ER/PR positive, HER-2 negative invasive carcinoma of the upper outer quadrant of the left breast.   INTERVAL HISTORY: Patient is 74 year old female with history of breast cancer who returns to clinic for continued surveillance of breast cancer. She completed 5 years of AI therapy in 2023. Also discontinued boniva . She clinically feels well and denies breast lumps, pain, skin changes, or nipple discharge. Denies new bone pain. No unintentional weight loss. Denies other complaints.   REVIEW OF SYSTEMS:   Review of Systems  Constitutional: Negative.  Negative for fever, malaise/fatigue and weight loss.  Respiratory: Negative.  Negative for cough and shortness of breath.   Cardiovascular: Negative.  Negative for chest pain and leg swelling.  Gastrointestinal: Negative.  Negative for abdominal pain and constipation.  Genitourinary: Negative.  Negative for hematuria.  Musculoskeletal: Negative.  Negative for joint pain.  Skin: Negative.  Negative for rash.  Neurological: Negative.  Negative for sensory change, focal weakness and weakness.  Psychiatric/Behavioral: Negative.  The patient is not nervous/anxious and does not have insomnia.   As per HPI. Otherwise, a complete review of systems is negative.   PAST MEDICAL HISTORY: Past Medical History:  Diagnosis Date   Arthritis    hands   Breast cancer (HCC) 02/15/2016   pT1b, N0; ER 90%, PR 90%, HER-2/neu not overexpressed. Closest margin 1 mm.Mammoprint: Low riisk.   Cataract    GERD (gastroesophageal reflux disease)    Head cold     runny nose started Dec 3   Hemorrhoids    History of shingles 2018   Hyperlipidemia    Hypertension    Osteoporosis    Personal history of radiation therapy 2017   MammoSite XRT    PAST SURGICAL HISTORY: Past Surgical History:  Procedure Laterality Date   BREAST BIOPSY Left 02/15/2016   INVASIVE MAMMARY CARCINOMA   BREAST BIOPSY Left 06/08/2022   Stereo bx, Ribbon Clip,- BENIGN FIBROADIPOSE AND MAMMARY PARENCHYMA, WITH FOCAL FIBROADENOMATOID CHANGES. - NEGATIVE FOR ATYPICAL PROLIFERATIVE BREAST DISEASE.   BREAST BIOPSY Left 06/08/2022   MM LT BREAST BX W LOC DEV 1ST LESION IMAGE BX SPEC STEREO GUIDE 06/08/2022 [facility]   BREAST LUMPECTOMY WITH SENTINEL LYMPH NODE BIOPSY Left 03/22/2016   Procedure: BREAST LUMPECTOMY WITH SENTINEL LYMPH NODE BX;  Surgeon: [name] ORN Cota, MD;  Location: [facility];  Service: General;  Laterality: Left;   CATARACT EXTRACTION W/PHACO Right 05/19/2020   Procedure: CATARACT EXTRACTION PHACO AND INTRAOCULAR LENS PLACEMENT (IOC) RIGHT 12.25 01:08.8 ;  Surgeon: Jaye Fallow, MD;  Location: [facility];  Service: Ophthalmology;  Laterality: Right;   COLONOSCOPY  2016   TUBAL LIGATION      FAMILY HISTORY: Family History  Problem Relation Age of Onset   Colon cancer Mother    Breast cancer Maternal Aunt    Prostate cancer Father     ADVANCED DIRECTIVES (Y/N):  N  HEALTH MAINTENANCE: Social History   Tobacco Use   Smoking status: Every Day    Current packs/day: 1.00    Average packs/day: 1 pack/day for 40.0 years (40.0 ttl pk-yrs)    Types: Cigarettes   Smokeless tobacco: Never  Vaping  Use   Vaping status: Never Used  Substance Use Topics   Alcohol use: Yes    Alcohol/week: 2.0 standard drinks of alcohol    Types: 2 Glasses of wine per week   Drug use: No     Colonoscopy:  PAP:  Bone density:  Lipid panel:  Allergies  Allergen Reactions   Ibuprofen Swelling    lips   Sulfamethizole Swelling    Other reaction(s):  UNKNOWN    Current Outpatient Medications  Medication Sig Dispense Refill   atorvastatin  (LIPITOR) 20 MG tablet Take 2 tablets by mouth daily.     calcium carbonate (OSCAL) 1500 (600 Ca) MG TABS tablet Take 1,500 mg by mouth daily with breakfast.     hydroxychloroquine  (PLAQUENIL ) 200 MG tablet Take 400 mg by mouth daily.      losartan -hydrochlorothiazide (HYZAAR ) 50-12.5 MG tablet Take 1 tablet by mouth daily.      meloxicam  (MOBIC ) 15 MG tablet      Multiple Vitamin (MULTIVITAMIN) tablet Take 1 tablet by mouth daily.     naproxen  (NAPROSYN ) 500 MG tablet Take 500 mg by mouth in the morning and at bedtime.     omeprazole (PRILOSEC) 20 MG capsule Take 20 mg by mouth daily.      No current facility-administered medications for this visit.    OBJECTIVE: Vitals:   06/09/23 0925  BP: (!) 151/76  Pulse: 93  Temp: 98.2 F (36.8 C)  SpO2: 100%     Body mass index is 25.34 kg/m.    ECOG FS:0 - Asymptomatic  General: Well-developed, well-nourished, no acute distress. Eyes: Pink conjunctiva, anicteric sclera. HEENT: Normocephalic, moist mucous membranes. Lungs: No audible wheezing or coughing. Abdomen: no obvious distention. Musculoskeletal: No edema, cyanosis, or clubbing. Neuro: Alert, answering all questions appropriately. Cranial nerves grossly intact. Skin: No rashes or petechiae noted. Psych: Normal affect.  STUDIES: MM 3D SCREENING MAMMOGRAM BILATERAL BREAST Result Date: 05/16/2023 CLINICAL DATA:  Screening. EXAM: DIGITAL SCREENING BILATERAL MAMMOGRAM WITH TOMOSYNTHESIS AND CAD TECHNIQUE: Bilateral screening digital craniocaudal and mediolateral oblique mammograms were obtained. Bilateral screening digital breast tomosynthesis was performed. The images were evaluated with computer-aided detection. COMPARISON:  Previous exam(s). ACR Breast Density Category b: There are scattered areas of fibroglandular density. FINDINGS: There are no findings suspicious for malignancy.  IMPRESSION: No mammographic evidence of malignancy. A result letter of this screening mammogram will be mailed directly to the patient. RECOMMENDATION: Screening mammogram in one year. (Code:[id number]-B-01Y) BI-RADS CATEGORY  1: Negative. Electronically Signed   By: Rosaline Collet M.D.   On: 05/16/2023 17:33    ASSESSMENT: Pathologic stage IA ER/PR positive, HER-2 negative invasive carcinoma of the upper outer quadrant of the left breast. Low risk MammoPrint.  PLAN:    1. Pathologic stage IA ER/PR positive, HER-2 negative invasive carcinoma of the upper outer quadrant of the left breast: s/p lumpectomy 03/22/16. Mammoprint was low risk therefore she did not require adjuvant chemotherapy. She received MammoSite XRT. Given ER/PR positivity, she started AI but ultimately switched from letrozole  to anastrozole  d/t hair loss. Tolerated well and she completed 5 years of therapy in 04/2021. Extended AI therapy was not recommended given MammoPrint results. Mammogram on 05/12/22 was reported as incomplete for possible mass in right breast and possible asymmetry in left breast. Follow up diagnostic mammogram and ultrasound were negative. Repeat bilateral mammogram 05/16/2023 was reported as birads 1: negative. She will continue annual surveillance with mammogram. Clinically asymptomatic of recurrence. Today we reviewed survivorship topics and symptoms that would warrant  sooner return. She will continue self breast exams.   2.  Osteopenia: Patient's most recent bone mineral density on 12, 7, 2023 was reported as -1.8. This is slightly improved when compared to previous when she had reported t scores of -2, -2.1, and -1.9. She has discontinued boniva  but will continue calcium and vitamin d supplementation along with weight bearing exercise as tolerated. Plan to repeat bone density scan in 05/2024.   Disposition:  Follow up imaging as recommended: diagnostic mammogram & ultrasound with possible biopsy If normal, rtc in 1  year for continued breast cancer surveillance with myself or Dr. Jacobo.   Patient expressed understanding and was in agreement with this plan. She also understands that She can call clinic at any time with any questions, concerns, or complaints.    Cancer Staging  Malignant neoplasm of female breast [facility]) Staging form: Breast, AJCC 7th Edition - Pathologic stage from 04/04/2016: Stage IA (T1b, N0, cM0) - Signed by [name], MD on 04/04/2016 Laterality: Left Estrogen receptor status: Positive Progesterone receptor status: Positive HER2 status: Negative  Tinnie KANDICE Dawn, NP 06/09/23

## 2023-07-05 DIAGNOSIS — L905 Scar conditions and fibrosis of skin: Secondary | ICD-10-CM | POA: Diagnosis not present

## 2023-07-05 DIAGNOSIS — D0361 Melanoma in situ of right upper limb, including shoulder: Secondary | ICD-10-CM | POA: Diagnosis not present

## 2023-11-02 DIAGNOSIS — D225 Melanocytic nevi of trunk: Secondary | ICD-10-CM | POA: Diagnosis not present

## 2023-11-02 DIAGNOSIS — Z86006 Personal history of melanoma in-situ: Secondary | ICD-10-CM | POA: Diagnosis not present

## 2023-11-02 DIAGNOSIS — L821 Other seborrheic keratosis: Secondary | ICD-10-CM | POA: Diagnosis not present

## 2023-11-02 DIAGNOSIS — D2272 Melanocytic nevi of left lower limb, including hip: Secondary | ICD-10-CM | POA: Diagnosis not present

## 2023-11-02 DIAGNOSIS — D2262 Melanocytic nevi of left upper limb, including shoulder: Secondary | ICD-10-CM | POA: Diagnosis not present

## 2023-11-02 DIAGNOSIS — Z85820 Personal history of malignant melanoma of skin: Secondary | ICD-10-CM | POA: Diagnosis not present

## 2023-11-02 DIAGNOSIS — D2261 Melanocytic nevi of right upper limb, including shoulder: Secondary | ICD-10-CM | POA: Diagnosis not present

## 2023-11-02 DIAGNOSIS — D2271 Melanocytic nevi of right lower limb, including hip: Secondary | ICD-10-CM | POA: Diagnosis not present

## 2023-11-02 DIAGNOSIS — Z08 Encounter for follow-up examination after completed treatment for malignant neoplasm: Secondary | ICD-10-CM | POA: Diagnosis not present

## 2023-11-15 DIAGNOSIS — K219 Gastro-esophageal reflux disease without esophagitis: Secondary | ICD-10-CM | POA: Diagnosis not present

## 2023-11-15 DIAGNOSIS — Z0000 Encounter for general adult medical examination without abnormal findings: Secondary | ICD-10-CM | POA: Diagnosis not present

## 2023-11-15 DIAGNOSIS — R739 Hyperglycemia, unspecified: Secondary | ICD-10-CM | POA: Diagnosis not present

## 2023-11-15 DIAGNOSIS — M25552 Pain in left hip: Secondary | ICD-10-CM | POA: Diagnosis not present

## 2023-11-15 DIAGNOSIS — G8929 Other chronic pain: Secondary | ICD-10-CM | POA: Diagnosis not present

## 2023-11-15 DIAGNOSIS — I10 Essential (primary) hypertension: Secondary | ICD-10-CM | POA: Diagnosis not present

## 2023-11-15 DIAGNOSIS — Z79899 Other long term (current) drug therapy: Secondary | ICD-10-CM | POA: Diagnosis not present

## 2023-11-15 DIAGNOSIS — Z853 Personal history of malignant neoplasm of breast: Secondary | ICD-10-CM | POA: Diagnosis not present

## 2023-11-15 DIAGNOSIS — M8588 Other specified disorders of bone density and structure, other site: Secondary | ICD-10-CM | POA: Diagnosis not present

## 2023-11-15 DIAGNOSIS — M06042 Rheumatoid arthritis without rheumatoid factor, left hand: Secondary | ICD-10-CM | POA: Diagnosis not present

## 2023-11-15 DIAGNOSIS — M06041 Rheumatoid arthritis without rheumatoid factor, right hand: Secondary | ICD-10-CM | POA: Diagnosis not present

## 2023-11-20 DIAGNOSIS — M1612 Unilateral primary osteoarthritis, left hip: Secondary | ICD-10-CM | POA: Diagnosis not present

## 2023-11-20 DIAGNOSIS — M7062 Trochanteric bursitis, left hip: Secondary | ICD-10-CM | POA: Diagnosis not present

## 2023-12-27 DIAGNOSIS — S93402A Sprain of unspecified ligament of left ankle, initial encounter: Secondary | ICD-10-CM | POA: Diagnosis not present

## 2023-12-27 DIAGNOSIS — M25572 Pain in left ankle and joints of left foot: Secondary | ICD-10-CM | POA: Diagnosis not present

## 2023-12-27 DIAGNOSIS — M25571 Pain in right ankle and joints of right foot: Secondary | ICD-10-CM | POA: Diagnosis not present

## 2024-03-11 DIAGNOSIS — Z23 Encounter for immunization: Secondary | ICD-10-CM | POA: Diagnosis not present

## 2024-03-20 DIAGNOSIS — M7062 Trochanteric bursitis, left hip: Secondary | ICD-10-CM | POA: Diagnosis not present

## 2024-05-08 DIAGNOSIS — D2261 Melanocytic nevi of right upper limb, including shoulder: Secondary | ICD-10-CM | POA: Diagnosis not present

## 2024-05-08 DIAGNOSIS — D2272 Melanocytic nevi of left lower limb, including hip: Secondary | ICD-10-CM | POA: Diagnosis not present

## 2024-05-08 DIAGNOSIS — Z85820 Personal history of malignant melanoma of skin: Secondary | ICD-10-CM | POA: Diagnosis not present

## 2024-05-08 DIAGNOSIS — D2262 Melanocytic nevi of left upper limb, including shoulder: Secondary | ICD-10-CM | POA: Diagnosis not present

## 2024-05-08 DIAGNOSIS — D225 Melanocytic nevi of trunk: Secondary | ICD-10-CM | POA: Diagnosis not present

## 2024-05-08 DIAGNOSIS — Z86006 Personal history of melanoma in-situ: Secondary | ICD-10-CM | POA: Diagnosis not present

## 2024-05-15 ENCOUNTER — Other Ambulatory Visit: Payer: Self-pay | Admitting: Nurse Practitioner

## 2024-05-15 ENCOUNTER — Ambulatory Visit
Admission: RE | Admit: 2024-05-15 | Discharge: 2024-05-15 | Disposition: A | Discharge status: Home / Self Care | Source: Ambulatory Visit | Attending: Nurse Practitioner | Admitting: Nurse Practitioner

## 2024-05-15 DIAGNOSIS — Z853 Personal history of malignant neoplasm of breast: Secondary | ICD-10-CM

## 2024-05-15 DIAGNOSIS — M8580 Other specified disorders of bone density and structure, unspecified site: Secondary | ICD-10-CM

## 2024-05-15 DIAGNOSIS — Z1231 Encounter for screening mammogram for malignant neoplasm of breast: Secondary | ICD-10-CM | POA: Diagnosis not present

## 2024-05-15 DIAGNOSIS — M8589 Other specified disorders of bone density and structure, multiple sites: Secondary | ICD-10-CM | POA: Diagnosis not present

## 2024-05-15 DIAGNOSIS — Z780 Asymptomatic menopausal state: Secondary | ICD-10-CM | POA: Diagnosis not present

## 2024-05-15 DIAGNOSIS — Z08 Encounter for follow-up examination after completed treatment for malignant neoplasm: Secondary | ICD-10-CM | POA: Diagnosis not present

## 2024-06-10 ENCOUNTER — Inpatient Hospital Stay: Payer: Medicare Other | Attending: Nurse Practitioner | Admitting: Nurse Practitioner

## 2024-06-10 ENCOUNTER — Encounter: Payer: Self-pay | Admitting: Nurse Practitioner

## 2024-06-10 VITALS — BP 138/70 | HR 90 | Temp 97.5°F | Resp 16 | Wt 149.0 lb

## 2024-06-10 DIAGNOSIS — Z853 Personal history of malignant neoplasm of breast: Secondary | ICD-10-CM | POA: Diagnosis not present

## 2024-06-10 DIAGNOSIS — M85851 Other specified disorders of bone density and structure, right thigh: Secondary | ICD-10-CM

## 2024-06-10 DIAGNOSIS — M810 Age-related osteoporosis without current pathological fracture: Secondary | ICD-10-CM | POA: Diagnosis not present

## 2024-06-10 DIAGNOSIS — F17210 Nicotine dependence, cigarettes, uncomplicated: Secondary | ICD-10-CM | POA: Insufficient documentation

## 2024-06-10 DIAGNOSIS — Z803 Family history of malignant neoplasm of breast: Secondary | ICD-10-CM | POA: Insufficient documentation

## 2024-06-10 DIAGNOSIS — Z08 Encounter for follow-up examination after completed treatment for malignant neoplasm: Secondary | ICD-10-CM

## 2024-06-10 DIAGNOSIS — Z923 Personal history of irradiation: Secondary | ICD-10-CM | POA: Diagnosis not present

## 2024-06-10 DIAGNOSIS — Z79899 Other long term (current) drug therapy: Secondary | ICD-10-CM | POA: Insufficient documentation

## 2024-06-10 DIAGNOSIS — Z800 Family history of malignant neoplasm of digestive organs: Secondary | ICD-10-CM | POA: Diagnosis not present

## 2024-06-10 DIAGNOSIS — Z8042 Family history of malignant neoplasm of prostate: Secondary | ICD-10-CM | POA: Diagnosis not present

## 2024-06-10 NOTE — Addendum Note (Signed)
 Addended by: Orien Mayhall G on: 06/10/2024 09:49 AM   Modules accepted: Orders

## 2024-06-10 NOTE — Progress Notes (Signed)
 " [facility]  [phone] [phone]  [name] OB: February 09, 1951  MR#: [id number]  RDW#:[id number]  Patient Care Team: [name], MD as PCP - General (Internal Medicine) [name], NP as Nurse Practitioner [name], [name] ORN, MD (General Surgery) [name], MD as Consulting Physician (Oncology)  CHIEF COMPLAINT: Pathologic stage IA ER/PR positive, HER-2 negative invasive carcinoma of the upper outer quadrant of the left breast.   INTERVAL HISTORY: Patient is 74 year old female with history of breast cancer who returns to clinic for continued surveillance of breast cancer. She completed 5 years of AI therapy in 2023. Also discontinued boniva . She clinically feels well and denies breast lumps, pain, skin changes, or nipple discharge. Denies new bone pain. No unintentional weight loss. Denies other complaints.   REVIEW OF SYSTEMS:   Review of Systems  Constitutional: Negative.  Negative for fever, malaise/fatigue and weight loss.  Respiratory: Negative.  Negative for cough and shortness of breath.   Cardiovascular: Negative.  Negative for chest pain and leg swelling.  Gastrointestinal: Negative.  Negative for abdominal pain and constipation.  Genitourinary: Negative.  Negative for hematuria.  Musculoskeletal: Negative.  Negative for joint pain.  Skin: Negative.  Negative for rash.  Neurological: Negative.  Negative for sensory change, focal weakness and weakness.  Psychiatric/Behavioral: Negative.  The patient is not nervous/anxious and does not have insomnia.   As per HPI. Otherwise, a complete review of systems is negative.   PAST MEDICAL HISTORY: Past Medical History:  Diagnosis Date   Arthritis    hands   Breast cancer (HCC) 02/15/2016   pT1b, N0; ER 90%, PR 90%, HER-2/neu not overexpressed. Closest margin 1 mm.Mammoprint: Low riisk.   Cataract    GERD (gastroesophageal reflux disease)    Head cold     runny nose started Dec 3   Hemorrhoids    History of shingles 2018   Hyperlipidemia    Hypertension    Osteoporosis    Personal history of radiation therapy 2017   MammoSite XRT    PAST SURGICAL HISTORY: Past Surgical History:  Procedure Laterality Date   BREAST BIOPSY Left 02/15/2016   INVASIVE MAMMARY CARCINOMA   BREAST BIOPSY Left 06/08/2022   Stereo bx, Ribbon Clip,- BENIGN FIBROADIPOSE AND MAMMARY PARENCHYMA, WITH FOCAL FIBROADENOMATOID CHANGES. - NEGATIVE FOR ATYPICAL PROLIFERATIVE BREAST DISEASE.   BREAST BIOPSY Left 06/08/2022   MM LT BREAST BX W LOC DEV 1ST LESION IMAGE BX SPEC STEREO GUIDE 06/08/2022 [facility]   BREAST LUMPECTOMY WITH SENTINEL LYMPH NODE BIOPSY Left 03/22/2016   Procedure: BREAST LUMPECTOMY WITH SENTINEL LYMPH NODE BX;  Surgeon: [name] ORN Cota, MD;  Location: [facility];  Service: General;  Laterality: Left;   CATARACT EXTRACTION W/PHACO Right 05/19/2020   Procedure: CATARACT EXTRACTION PHACO AND INTRAOCULAR LENS PLACEMENT (IOC) RIGHT 12.25 01:08.8 ;  Surgeon: Jaye Fallow, MD;  [facility]: [facility];  Service: Ophthalmology;  Laterality: Right;   COLONOSCOPY  2016   TUBAL LIGATION      FAMILY HISTORY: Family History  Problem Relation Age of Onset   Colon cancer Mother    Breast cancer Maternal Aunt    Prostate cancer Father     ADVANCED DIRECTIVES (Y/N):  N  HEALTH MAINTENANCE: Social History   Tobacco Use   Smoking status: Every Day    Current packs/day: 1.00    Average packs/day: 1 pack/day for 40.0 years (40.0 ttl pk-yrs)    Types: Cigarettes   Smokeless tobacco: Never  Vaping Use   Vaping status: Never Used  Substance Use Topics   Alcohol use: Yes    Alcohol/week: 2.0 standard drinks of alcohol    Types: 2 Glasses of wine per week   Drug use: No     Colonoscopy:  PAP:  Bone density:  Lipid panel:  Allergies  Allergen Reactions   Ibuprofen Swelling    lips   Sulfamethizole Swelling    Other reaction(s):  UNKNOWN    Current Outpatient Medications  Medication Sig Dispense Refill   acetaminophen  (TYLENOL ) 160 mg/5 mL SOLN Take 650 mg by mouth.     atorvastatin (LIPITOR) 20 MG tablet Take 2 tablets by mouth daily.     calcium carbonate (OSCAL) 1500 (600 Ca) MG TABS tablet Take 1,500 mg by mouth daily with breakfast.     hydroxychloroquine (PLAQUENIL) 200 MG tablet Take 400 mg by mouth daily.      losartan-hydrochlorothiazide (HYZAAR) 50-12.5 MG tablet Take 1 tablet by mouth daily.      meloxicam (MOBIC) 15 MG tablet      Multiple Vitamin (MULTIVITAMIN) tablet Take 1 tablet by mouth daily.     omeprazole (PRILOSEC) 20 MG capsule Take 20 mg by mouth daily.      naproxen (NAPROSYN) 500 MG tablet Take 500 mg by mouth in the morning and at bedtime. (Patient not taking: Reported on 06/10/2024)     No current facility-administered medications for this visit.    OBJECTIVE: Vitals:   06/10/24 0935  BP: 138/70  Pulse: 90  Resp: 16  Temp: (!) 97.5 F (36.4 C)  SpO2: 100%     Body mass index is 24.05 kg/m.    ECOG FS:0 - Asymptomatic  General: Well-developed, well-nourished, no acute distress. Eyes: Pink conjunctiva, anicteric sclera. HEENT: Normocephalic, moist mucous membranes. Lungs: No audible wheezing or coughing. Abdomen: no obvious distention. Musculoskeletal: No edema, cyanosis, or clubbing. Neuro: Alert, answering all questions appropriately. Cranial nerves grossly intact. Skin: No rashes or petechiae noted. Psych: Normal affect.  STUDIES: MM 3D SCREENING MAMMOGRAM BILATERAL BREAST Result Date: 05/21/2024 CLINICAL DATA:  Screening. EXAM: DIGITAL SCREENING BILATERAL MAMMOGRAM WITH TOMOSYNTHESIS AND CAD TECHNIQUE: Bilateral screening digital craniocaudal and mediolateral oblique mammograms were obtained. Bilateral screening digital breast tomosynthesis was performed. The images were evaluated with computer-aided detection. COMPARISON:  Previous exam(s). ACR Breast Density Category b:  There are scattered areas of fibroglandular density. FINDINGS: There are no findings suspicious for malignancy. There is stable density and architectural distortion within the LEFT breast, corresponding to the site of remote prior surgery. IMPRESSION: No mammographic evidence of malignancy. A result letter of this screening mammogram will be mailed directly to the patient. RECOMMENDATION: Screening mammogram in one year. (Code:SM-B-01Y) BI-RADS CATEGORY  2: Benign. Electronically Signed   By: Norleen Croak M.D.   On: 05/21/2024 16:37   DG Bone Density Result Date: 05/15/2024 EXAM: DUAL X-RAY ABSORPTIOMETRY (DXA) FOR BONE MINERAL DENSITY 05/15/2024 9:26 am CLINICAL DATA:  74 year old Female Postmenopausal. History of breast cancer History of fragility fracture. Patient is or has been on bone building therapies. TECHNIQUE: An axial (e.g., hips, spine) and/or appendicular (e.g., radius) exam was performed, as appropriate, using GE Secretary/administrator at [facility]. Images are obtained for bone mineral density measurement and are not obtained for diagnostic purposes. [id number] Exclusions: Lumbar spine due to degenerative changes. COMPARISON:  05/12/2022. FINDINGS: Scan quality: Good. LEFT FEMORAL NECK: BMD (in g/cm2): 0.872 T-score: -1.2 Z-score: 0.5 LEFT TOTAL HIP: BMD (in g/cm2):  0.835 T-score: -1.4 Z-score: 0.1 RIGHT FEMORAL NECK: BMD (in g/cm2): 0.797 T-score: -1.7 Z-score: 0.0 RIGHT TOTAL HIP: BMD (in g/cm2): 0.770 T-score: -1.9 Z-score: -0.4 DUAL-FEMUR TOTAL MEAN: Rate of change from previous exam: No significant rate of change from previous exam. LEFT FOREARM (RADIUS 33%): BMD (in g/cm2): 0.771 T-score: -1.2 Z-score: 0.7 Rate of change from previous exam: No significant rate of change from previous exam. FRAX 10-YEAR PROBABILITY OF FRACTURE: 10-year fracture risk is performed using the [facility] calculator based on patient-reported risk factors. Major osteoporotic  fracture: 18.0% Hip fracture: 4.9% Other situations known to alter the reliability of the FRAX score should be considered when making treatment decisions, including chronic glucocorticoid use and past treatments. Further guidance on treatment can be found at the [facility]oporosis Foundation's website https://www.[email or web address].com/. IMPRESSION: Osteopenia based on BMD. Fracture risk is unknown due to history of bone building therapy. RECOMMENDATIONS: 1. All patients should optimize calcium and vitamin D intake. 2. Consider FDA-approved medical therapies in postmenopausal women and men aged 49 years and older, based on the following: - A hip or vertebral (clinical or morphometric) fracture - T-score less than or equal to -2.5 and secondary causes have been excluded. - Low bone mass (T-score between -1.0 and -2.5) and a 10-year probability of a hip fracture greater than or equal to 3% or a 10-year probability of a major osteoporosis-related fracture greater than or equal to 20% based on the [place] -adapted WHO algorithm. - Clinician judgment and/or patient preferences may indicate treatment for people with 10-year fracture probabilities above or below these levels 3. Patients with diagnosis of osteoporosis or at high risk for fracture should have regular bone mineral density tests. For patients eligible for Medicare, routine testing is allowed once every 2 years. The testing frequency can be increased to one year for patients who have rapidly progressing disease, those who are receiving or discontinuing medical therapy to restore bone mass, or have additional risk factors. Electronically Signed   By: Alm Parkins M.D.   On: 05/15/2024 09:49    ASSESSMENT: Pathologic stage IA ER/PR positive, HER-2 negative invasive carcinoma of the upper outer quadrant of the left breast. Low risk MammoPrint.  PLAN:    1. Pathologic stage IA ER/PR positive, HER-2 negative invasive carcinoma of the upper outer quadrant of the left breast: s/p  lumpectomy 03/22/16. Mammoprint was low risk therefore she did not require adjuvant chemotherapy. She received MammoSite XRT. Given ER/PR positivity, she started AI but ultimately switched from letrozole  to anastrozole  d/t hair loss. Tolerated well and she completed 5 years of therapy in 04/2021. Extended AI therapy was not recommended given MammoPrint results. Mammogram on 05/12/22 was reported as incomplete for possible mass in right breast and possible asymmetry in left breast. Follow up diagnostic mammogram and ultrasound were negative. Repeat bilateral mammogram 05/16/2023 was reported as birads 1: negative. Her most recent mammogram 05/15/24 was reported as BI-RADS 2: benign with stable density and distortion noted within the left breast and corresponds to her history of lumpectomy. Recommend mammogram in December 2026. Clinically asymptomatic of recurrence. Today we reviewed survivorship topics and symptoms that would warrant sooner return. She will continue self breast exams.   2.  Osteopenia: Patient's bone mineral density on 05/12/2022 was reported as -1.8 which was slightly improved compared to previously reported t scores of -2, -2.1, and -1.9. Her most recent bone density scan on 05/24/2024 -1.9 (hip) which is stable. She has discontinued boniva  but will continue calcium and vitamin  d supplementation along with weight bearing exercise as tolerated. Plan to repeat bone density scan in 05/2026.   Disposition:  Dec 2026 mammogram 1 year- breast surveillance with me- [facility]  Patient expressed understanding and was in agreement with this plan. She also understands that She can call clinic at any time with any questions, concerns, or complaints.    Cancer Staging  Malignant neoplasm of female breast [facility]) Staging form: Breast, AJCC 7th Edition - Pathologic stage from 04/04/2016: Stage IA (T1b, N0, cM0) - Signed by [name], MD on 04/04/2016 Laterality: Left Estrogen receptor status:  Positive Progesterone receptor status: Positive HER2 status: Negative  Tinnie KANDICE Dawn, NP 06/10/2024   "

## 2025-06-10 ENCOUNTER — Inpatient Hospital Stay: Admitting: Nurse Practitioner
